# Patient Record
Sex: Male | Born: 1975
Health system: Southern US, Community
[De-identification: ages and names within clinical notes are randomized; demographics above are authoritative.]

## PROBLEM LIST (undated history)

## (undated) DIAGNOSIS — F419 Anxiety disorder, unspecified: Secondary | ICD-10-CM

## (undated) HISTORY — DX: Anxiety disorder, unspecified: F41.9

---

## 2001-08-04 ENCOUNTER — Emergency Department (HOSPITAL_COMMUNITY): Admission: EM | Admit: 2001-08-04 | Discharge: 2001-08-04 | Payer: Self-pay | Admitting: Emergency Medicine

## 2001-08-04 ENCOUNTER — Encounter: Payer: Self-pay | Admitting: Emergency Medicine

## 2015-06-03 ENCOUNTER — Ambulatory Visit (INDEPENDENT_AMBULATORY_CARE_PROVIDER_SITE_OTHER): Payer: 59 | Admitting: Emergency Medicine

## 2015-06-03 VITALS — BP 132/60 | HR 59 | Temp 98.2°F | Resp 16 | Ht 72.0 in | Wt 185.8 lb

## 2015-06-03 DIAGNOSIS — G5702 Lesion of sciatic nerve, left lower limb: Secondary | ICD-10-CM | POA: Diagnosis not present

## 2015-06-03 MED ORDER — NAPROXEN SODIUM 550 MG PO TABS: 550.0000 mg | ORAL_TABLET | Freq: Two times a day (BID) | ORAL | Status: AC

## 2015-06-03 MED ORDER — CYCLOBENZAPRINE HCL 10 MG PO TABS: 10.0000 mg | ORAL_TABLET | Freq: Three times a day (TID) | ORAL | Status: DC | PRN

## 2015-06-03 NOTE — Progress Notes (Signed)
Subjective:  Patient ID: Steve Bender, male    DOB: 13-May-1976  Age: 39 y.o. MRN: [id number]  CC: OTHER   HPI Steve Bender presents  with a two-week history of pain in his right buttock in the area sciatic notch. He has no history of injury or overuse. He has no radicular symptoms numbness tingling or weakness. No radiation of pain. He said his pain is worse when he sits he is unable to sit on his buttock. He said he began be aware of the pain while sitting on the counter to shave his head. He's had no nausea vomiting diarrhea. No stool change. No blood in stool or black stool. No perirectal mass.  History Steve Bender has no past medical history on file.   He has no past surgical history on file.   His  family history is not on file.  He   reports that he has been smoking.  He does not have any smokeless tobacco history on file. He reports that he drinks about 0.6 oz of alcohol per week. He reports that he does not use illicit drugs.  No outpatient prescriptions prior to visit.   No facility-administered medications prior to visit.    History   Social History  . Marital Status: Married    Spouse Name: N/A  . Number of Children: N/A  . Years of Education: N/A   Social History Main Topics  . Smoking status: Current Every Day Smoker  . Smokeless tobacco: Not on file  . Alcohol Use: 0.6 oz/week    0 Standard drinks or equivalent, 1 Cans of beer per week  . Drug Use: No  . Sexual Activity: Not on file   Other Topics Concern  . None   Social History Narrative  . None     Review of Systems  Constitutional: Negative for fever, chills and appetite change.  HENT: Negative for congestion, ear pain, postnasal drip, sinus pressure and sore throat.   Eyes: Negative for pain and redness.  Respiratory: Negative for cough, shortness of breath and wheezing.   Cardiovascular: Negative for leg swelling.  Gastrointestinal: Negative for nausea, vomiting, abdominal pain, diarrhea,  constipation and blood in stool.  Endocrine: Negative for polyuria.  Genitourinary: Negative for dysuria, urgency, frequency and flank pain.  Musculoskeletal: Negative for gait problem.  Skin: Negative for rash.  Neurological: Negative for weakness and headaches.  Psychiatric/Behavioral: Negative for confusion and decreased concentration. The patient is not nervous/anxious.     Objective:  BP 132/60 mmHg  Pulse 59  Temp(Src) 98.2 F (36.8 C) (Oral)  Resp 16  Ht 6' (1.829 m)  Wt 185 lb 12.8 oz (84.278 kg)  BMI 25.19 kg/m2  SpO2 98%  Physical Exam  Constitutional: He is oriented to person, place, and time. He appears well-developed and well-nourished. No distress.  HENT:  Head: Normocephalic and atraumatic.  Right Ear: External ear normal.  Left Ear: External ear normal.  Nose: Nose normal.  Eyes: Conjunctivae and EOM are normal. Pupils are equal, round, and reactive to light. No scleral icterus.  Neck: Normal range of motion. Neck supple. No tracheal deviation present.  Cardiovascular: Normal rate, regular rhythm and normal heart sounds.   Pulmonary/Chest: Effort normal. No respiratory distress. He has no wheezes. He has no rales.  Abdominal: He exhibits no mass. There is no tenderness. There is no rebound and no guarding.  Musculoskeletal: He exhibits no edema.  Lymphadenopathy:    He has no cervical adenopathy.  Neurological: He is  alert and oriented to person, place, and time. Coordination normal.  Skin: Skin is warm and dry. No rash noted.  Psychiatric: He has a normal mood and affect. His behavior is normal.   he was found to have moderate tenderness right in the sciatic notch.    Assessment & Plan:   Harvis was seen today for other.  Diagnoses and all orders for this visit:  Pyriformis syndrome, left  Other orders -     naproxen sodium (ANAPROX DS) 550 MG tablet; Take 1 tablet (550 mg total) by mouth 2 (two) times daily with a meal. -     cyclobenzaprine  (FLEXERIL) 10 MG tablet; Take 1 tablet (10 mg total) by mouth 3 (three) times daily as needed for muscle spasms.   I am having Mr. Seipp start on naproxen sodium and cyclobenzaprine.  Meds ordered this encounter  Medications  . naproxen sodium (ANAPROX DS) 550 MG tablet    Sig: Take 1 tablet (550 mg total) by mouth 2 (two) times daily with a meal.    Dispense:  40 tablet    Refill:  0  . cyclobenzaprine (FLEXERIL) 10 MG tablet    Sig: Take 1 tablet (10 mg total) by mouth 3 (three) times daily as needed for muscle spasms.    Dispense:  30 tablet    Refill:  0    Appropriate red flag conditions were discussed with the patient as well as actions that should be taken.  Patient expressed his understanding.  Follow-up: Return if symptoms worsen or fail to improve.  Carmelina Dane, MD

## 2015-06-10 ENCOUNTER — Ambulatory Visit (INDEPENDENT_AMBULATORY_CARE_PROVIDER_SITE_OTHER): Payer: 59 | Admitting: Gastroenterology

## 2015-06-10 ENCOUNTER — Encounter: Payer: Self-pay | Admitting: Gastroenterology

## 2015-06-10 VITALS — BP 124/60 | HR 84 | Ht 71.25 in | Wt 186.0 lb

## 2015-06-10 DIAGNOSIS — K6289 Other specified diseases of anus and rectum: Secondary | ICD-10-CM

## 2015-06-10 MED ORDER — HYDROCORTISONE ACETATE 25 MG RE SUPP: RECTAL | Status: DC

## 2015-06-10 NOTE — Assessment & Plan Note (Signed)
Rectal discomfort is most likely hemorrhoidal.  There are no obvious lesions or perirectal disease.  Recommendations #1 trial of Anusol HC suppositories

## 2015-06-10 NOTE — Progress Notes (Signed)
    _                                                                                                                History of Present Illness:  Mr. Steve Bender is a 39 year old white male self-referred for evaluation of rectal discomfort.  For several weeks he's noted spontaneous discomfort in the anal area both with sitting and with walking.  He's had no rectal bleeding or pruritus.  He moves his bowels regularly.   History reviewed. No pertinent past medical history. History reviewed. No pertinent past surgical history. family history includes Crohn's disease in his mother. Current Outpatient Prescriptions  Medication Sig Dispense Refill  . cyclobenzaprine (FLEXERIL) 10 MG tablet Take 1 tablet (10 mg total) by mouth 3 (three) times daily as needed for muscle spasms. 30 tablet 0  . naproxen sodium (ANAPROX DS) 550 MG tablet Take 1 tablet (550 mg total) by mouth 2 (two) times daily with a meal. 40 tablet 0   No current facility-administered medications for this visit.   Allergies as of 06/10/2015  . (No Known Allergies)    reports that he has been smoking Cigarettes.  He has never used smokeless tobacco. He reports that he drinks about 1.2 oz of alcohol per week. He reports that he does not use illicit drugs.   Review of Systems: Pertinent positive and negative review of systems were noted in the above HPI section. All other review of systems were otherwise negative.  Vital signs were reviewed in today's medical record Physical Exam: General: Well developed , well nourished, no acute distress Skin: anicteric Head: Normocephalic and atraumatic Eyes:  sclerae anicteric, EOMI Ears: Normal auditory acuity Mouth: No deformity or lesions Neck: Supple, no masses or thyromegaly Lymph Nodes: no lymphadenopathy Lungs: Clear throughout to auscultation Heart: Regular rate and rhythm; no murmurs, rubs or bruits Gastroinestinal: Soft, non tender and non distended. No masses,  hepatosplenomegaly or hernias noted. Normal Bowel sounds Rectal: No external abnormalities Musculoskeletal: Symmetrical with no gross deformities  Skin: No lesions on visible extremities Pulses:  Normal pulses noted Extremities: No clubbing, cyanosis, edema or deformities noted Neurological: Alert oriented x 4, grossly nonfocal Cervical Nodes:  No significant cervical adenopathy Inguinal Nodes: No significant inguinal adenopathy Psychological:  Alert and cooperative. Normal mood and affect  Endoscopy demonstrated a small hemorrhoid in the right posterior position

## 2015-06-10 NOTE — Patient Instructions (Signed)
We will send in your prescription to your pharmacy

## 2015-09-24 ENCOUNTER — Ambulatory Visit: Payer: Self-pay | Admitting: Family Medicine

## 2016-07-14 ENCOUNTER — Encounter (HOSPITAL_BASED_OUTPATIENT_CLINIC_OR_DEPARTMENT_OTHER): Payer: Self-pay

## 2016-07-14 ENCOUNTER — Emergency Department (HOSPITAL_BASED_OUTPATIENT_CLINIC_OR_DEPARTMENT_OTHER): Payer: 59

## 2016-07-14 ENCOUNTER — Emergency Department (HOSPITAL_BASED_OUTPATIENT_CLINIC_OR_DEPARTMENT_OTHER)
Admission: EM | Admit: 2016-07-14 | Discharge: 2016-07-14 | Disposition: A | Discharge status: Home / Self Care | Payer: 59 | Attending: Emergency Medicine | Admitting: Emergency Medicine

## 2016-07-14 DIAGNOSIS — Z87891 Personal history of nicotine dependence: Secondary | ICD-10-CM | POA: Insufficient documentation

## 2016-07-14 DIAGNOSIS — R1013 Epigastric pain: Secondary | ICD-10-CM | POA: Insufficient documentation

## 2016-07-14 DIAGNOSIS — R0789 Other chest pain: Secondary | ICD-10-CM | POA: Insufficient documentation

## 2016-07-14 LAB — CBC WITH DIFFERENTIAL/PLATELET
Basophils Absolute: 0 10*3/uL (ref 0.0–0.1)
Basophils Relative: 1 %
EOS PCT: 5 %
Eosinophils Absolute: 0.4 10*3/uL (ref 0.0–0.7)
HEMATOCRIT: 47.2 % (ref 39.0–52.0)
Hemoglobin: 16.8 g/dL (ref 13.0–17.0)
LYMPHS ABS: 2.6 10*3/uL (ref 0.7–4.0)
LYMPHS PCT: 37 %
MCH: 31 pg (ref 26.0–34.0)
MCHC: 35.6 g/dL (ref 30.0–36.0)
MCV: 87.1 fL (ref 78.0–100.0)
MONO ABS: 0.9 10*3/uL (ref 0.1–1.0)
MONOS PCT: 13 %
NEUTROS ABS: 3.2 10*3/uL (ref 1.7–7.7)
Neutrophils Relative %: 44 %
PLATELETS: 224 10*3/uL (ref 150–400)
RBC: 5.42 MIL/uL (ref 4.22–5.81)
RDW: 12.9 % (ref 11.5–15.5)
WBC: 7.1 10*3/uL (ref 4.0–10.5)

## 2016-07-14 LAB — COMPREHENSIVE METABOLIC PANEL
ALBUMIN: 4.1 g/dL (ref 3.5–5.0)
ALK PHOS: 66 U/L (ref 38–126)
ALT: 27 U/L (ref 17–63)
ANION GAP: 6 (ref 5–15)
AST: 31 U/L (ref 15–41)
BILIRUBIN TOTAL: 0.3 mg/dL (ref 0.3–1.2)
BUN: 18 mg/dL (ref 6–20)
CALCIUM: 9.1 mg/dL (ref 8.9–10.3)
CO2: 27 mmol/L (ref 22–32)
Chloride: 106 mmol/L (ref 101–111)
Creatinine, Ser: 0.97 mg/dL (ref 0.61–1.24)
GFR calc Af Amer: >60 mL/min (ref >60)
GLUCOSE: 76 mg/dL (ref 65–99)
POTASSIUM: 3.9 mmol/L (ref 3.5–5.1)
Sodium: 139 mmol/L (ref 135–145)
TOTAL PROTEIN: 6.7 g/dL (ref 6.5–8.1)

## 2016-07-14 LAB — TROPONIN I: Troponin I: <0.03 ng/mL (ref <0.03)

## 2016-07-14 LAB — LIPASE, BLOOD: LIPASE: 20 U/L (ref 11–51)

## 2016-07-14 MED ORDER — ESOMEPRAZOLE MAGNESIUM 40 MG PO CPDR: 40.0000 mg | DELAYED_RELEASE_CAPSULE | Freq: Every day | ORAL | 1 refills | Status: DC

## 2016-07-14 MED ORDER — KETOROLAC TROMETHAMINE 30 MG/ML IJ SOLN
30.0000 mg | Freq: Once | INTRAMUSCULAR | Status: AC
  Administered 2016-07-14: 30 mg via INTRAVENOUS
  Filled 2016-07-14: qty 1

## 2016-07-14 MED ORDER — ALPRAZOLAM 0.25 MG PO TABS: 0.2500 mg | ORAL_TABLET | Freq: Every evening | ORAL | 0 refills | Status: DC | PRN

## 2016-07-14 MED FILL — ALPRAZolam 0.25 MG TABS: 0.25 | 10 days supply | Qty: 10 | Fill #0

## 2016-07-14 NOTE — Discharge Instructions (Signed)
Take nexium daily.   You can take pepcid 20 mg twice daily as needed.   Take xanax as needed.   [facility] for appointment.   Return to ER if you have worse chest pain, abdominal pain, vomiting, fever, trouble breathing.

## 2016-07-14 NOTE — ED Notes (Signed)
Dr. Silverio LayYao into room prior to RN assessment, see MD notes, pending orders.

## 2016-07-14 NOTE — ED Provider Notes (Signed)
[facility]   CSN: [id number] Arrival date & time: 07/14/16  1304     History   Chief Complaint Chief Complaint  Patient presents with  . Chest Pain    HPI Steve Bender is a 40 y.o. male here with chest pain, abdominal pain. Patient states that he quit smoking about 2 months ago. Since then he feels very jittery and has been very anxious. He also has been having intermittent left-sided chest pain that comes and goes. Denies any shortness of breath. Also has poor appetite and has been having some epigastric pain as well. Denies any nausea or vomiting or fevers. Denies any urinary symptoms. Patient still drinks alcohol occasionally. Has family hx of Crohn's but no personal hx, no blood in stool.   The history is provided by the patient.    History reviewed. No pertinent past medical history.  Patient Active Problem List   Diagnosis Date Noted  . Rectal pain 06/10/2015    History reviewed. No pertinent surgical history.     Home Medications    Prior to Admission medications   Not on File    Family History Family History  Problem Relation Age of Onset  . Crohn's disease Mother     Social History Social History  Substance Use Topics  . Smoking status: Former Smoker    Types: Cigarettes  . Smokeless tobacco: Never Used     Comment: quit June 2017  . Alcohol use 0.0 oz/week     Comment: weekly     Allergies   Review of patient's allergies indicates no known allergies.   Review of Systems Review of Systems  Cardiovascular: Positive for chest pain.  All other systems reviewed and are negative.    Physical Exam Updated Vital Signs BP 106/65   Pulse 72   Temp 98 F (36.7 C) (Oral)   Resp 18   Ht 6\' 1"  (1.854 m)   Wt 200 lb (90.7 kg)   SpO2 99%   BMI 26.39 kg/m   Physical Exam  Constitutional: He is oriented to person, place, and time.  Slightly anxious   HENT:  Head: Normocephalic.  Mouth/Throat: Oropharynx is  clear and moist.  Eyes: EOM are normal. Pupils are equal, round, and reactive to light.  Neck: Normal range of motion. Neck supple.  Cardiovascular: Normal rate, regular rhythm and normal heart sounds.   Pulmonary/Chest: Effort normal and breath sounds normal. No respiratory distress. He has no wheezes. He has no rales.  Mild reproducible chest tenderness   Abdominal: Soft. Bowel sounds are normal.  Mild epigastric tenderness   Musculoskeletal: Normal range of motion. He exhibits no edema.  Neurological: He is alert and oriented to person, place, and time.  Skin: Skin is warm.  Psychiatric: He has a normal mood and affect.  Nursing note and vitals reviewed.    ED Treatments / Results  Labs (all labs ordered are listed, but only abnormal results are displayed) Labs Reviewed  CBC WITH DIFFERENTIAL/PLATELET  TROPONIN I  COMPREHENSIVE METABOLIC PANEL  LIPASE, BLOOD    EKG  EKG Interpretation  Date/Time:  Thursday July 14 2016 13:12:07 EDT Ventricular Rate:  72 PR Interval:  158 QRS Duration: 98 QT Interval:  386 QTC Calculation: 422 R Axis:   97 Text Interpretation:  Normal sinus rhythm Rightward axis Nonspecific T wave abnormality Abnormal ECG No previous ECGs available Confirmed by YAO  MD, DAVID ([id number]) on 07/14/2016 1:23:14 PM       Radiology  Dg Chest 2 View  Result Date: 07/14/2016 CLINICAL DATA:  C/o mid to Lt sided chest pain since he quit smoking 2-3 months ago EXAM: CHEST  2 VIEW COMPARISON:  None. FINDINGS: The heart size and mediastinal contours are within normal limits. Both lungs are clear. No pleural effusion or pneumothorax. The visualized skeletal structures are unremarkable. IMPRESSION: No active cardiopulmonary disease. Electronically Signed   By: Amie Portland M.D.   On: 07/14/2016 13:31    Procedures Procedures (including critical care time)  Medications Ordered in ED Medications  ketorolac (TORADOL) 30 MG/ML injection 30 mg (30 mg Intravenous  Given 07/14/16 1414)     Initial Impression / Assessment and Plan / ED Course  I have reviewed the triage vital signs and the nursing notes.  Pertinent labs & imaging results that were available during my care of the patient were reviewed by me and considered in my medical decision making (see chart for details).  Clinical Course    Steve Bender is a 40 y.o. male here with chest pain, abdominal pain for 2 month. Appears anxious, well appearing. Vitals stable, not tachycardic. I think likely some nicotine withdrawal vs anxiety. Low risk for ACS and I doubt PE. Will get labs, lipase, LFTs. Will give toradol for pain.   3:08 PM Labs unremarkable. CXR unremarkable. I am wondering if he has some reflux vs anxiety. I reassured patient. Will give nexium daily, prn xanax (will only give 10 pills).   Final Clinical Impressions(s) / ED Diagnoses   Final diagnoses:  None    New Prescriptions New Prescriptions   No medications on file     Charlynne Pander, MD 07/14/16 1510

## 2016-07-14 NOTE — ED Notes (Addendum)
Pt reports he quit smoking about 2.5 months ago and since then he has had chest pains for a period of time and then a break. Last night stated he has a pressure that starts along the left side of his neck down to his chest. Reports it started about 2200 last night. Reports last night is the worst it has gotten. Reports has had acid reflux since quit smoking. NAD. Alert and oriented X4. No n/v/d. Reports hx of hemorrhoids. Reports no pain just a feeling a fullness in chest and abdomen.

## 2016-07-14 NOTE — ED Notes (Signed)
[facility] alert, NAD, calm, interactive, resps e/u, no dyspnea noted, ambulatory to xray, steady gait.

## 2016-07-14 NOTE — ED Triage Notes (Signed)
Intermittent CP, abd pain x 2 months-first noticed after quitting smoking-NAD-steady gait

## 2016-08-09 ENCOUNTER — Ambulatory Visit: Payer: 59 | Admitting: Family Medicine

## 2016-08-29 ENCOUNTER — Telehealth: Payer: Self-pay | Admitting: Gastroenterology

## 2016-08-30 NOTE — Telephone Encounter (Signed)
Pt states he has no insurance and wants to see if he could get med refilled that Dr Arlyce DiceKaplan prescribed.  He has symptoms of rectal bleeding and bloating, I advised he needs to be seen because he has not established with another provider.  I did give him the information to apply for the orange card and [facility]  Orange card number to apply is [phone]

## 2016-09-05 ENCOUNTER — Encounter: Payer: Self-pay | Admitting: Gastroenterology

## 2016-09-05 ENCOUNTER — Ambulatory Visit (INDEPENDENT_AMBULATORY_CARE_PROVIDER_SITE_OTHER): Payer: Self-pay | Admitting: Gastroenterology

## 2016-09-05 VITALS — BP 118/76 | HR 76 | Ht 73.0 in | Wt 201.0 lb

## 2016-09-05 DIAGNOSIS — R1013 Epigastric pain: Secondary | ICD-10-CM | POA: Insufficient documentation

## 2016-09-05 DIAGNOSIS — R140 Abdominal distension (gaseous): Secondary | ICD-10-CM

## 2016-09-05 DIAGNOSIS — G8929 Other chronic pain: Secondary | ICD-10-CM | POA: Insufficient documentation

## 2016-09-05 DIAGNOSIS — K644 Residual hemorrhoidal skin tags: Secondary | ICD-10-CM | POA: Insufficient documentation

## 2016-09-05 DIAGNOSIS — K6289 Other specified diseases of anus and rectum: Secondary | ICD-10-CM

## 2016-09-05 MED ORDER — HYDROCORTISONE 2.5 % RE CREA: 1.0000 "application " | TOPICAL_CREAM | Freq: Two times a day (BID) | RECTAL | 1 refills | Status: AC

## 2016-09-05 NOTE — Patient Instructions (Signed)
We have sent medications to your pharmacy for you to pick up at your convenience.  Use hydrocortisone cream for 7-10 more days 2-3 times daily  Use Florastor or culturelle probiotic daily.  Call back in about a week to update on symptoms

## 2016-09-05 NOTE — Progress Notes (Signed)
09/05/2016 Steve Bender [id number] 14-Apr-1976   History of Present Illness:  This is a 40 year old male who was previously seen once by Dr. Arlyce Dice for complaints of rectal pain in July 2016. There were no external abnormalities seen at that time, but he was treated empirically for internal hemorrhoids. He returns to our office on this occasion with several complaints. I had extreme difficulty trying to follow him and really make sense of or connect any of his symptoms. He admits that he has stopped smoking several months ago and has had great increase in anxiety since that time.  First, he complains of rectal discomfort that began 2 or 3 weeks ago. He says that he never noticed any external hemorrhoids previously but now he appears to have one. Has been using over-the-counter Preparation H suppositories and has also used some hydrocortisone 2.5% cream that he had at home, but says that he has only been using that once, sometimes twice daily. He also complains of his entire abdomen feeling bloated. He thinks he is constipated but says that he has soft bowel movements every 2-3 days. Bloating is somewhat worse when he has not had a BM in a day or two.  Denies any complaints of rectal bleeding. He says that yesterday he was pushing on his abdomen and thought he noticed a knot in his upper abdomen. He also says that 2 months ago while he was working out he felt a pop in his groin area and was not able to move for several minutes. He denies any reflux issues recently, but did have some intermittent issues with reflux in the past.  Had 20 pound weight gain when he stopped smoking.  He had a normal CBC, CMP, and lipase well he was in [facility] for chest pain on August 31 (chest pain thought to be anxiety related).  Current Medications, Allergies, Past Medical History, Past Surgical History, Family History and Social History were reviewed in [employer]  record.   Physical Exam: BP 118/76   Pulse 76   Ht 6\' 1"  (1.854 m)   Wt 201 lb (91.2 kg)   BMI 26.52 kg/m  General: Well developed white male in no acute distress Head: Normocephalic and atraumatic Eyes:  Sclerae anicteric, conjunctiva pink  Ears: Normal auditory acuity Lungs: Clear throughout to auscultation Heart: Regular rate and rhythm Abdomen: Soft, non-distended.  Normal bowel sounds.  Non-tender. Rectal:  ? Small left sided thrombosed external hemorrhoid.  No other abnormalities.  DRE did not reveal any masses and he was heme negative. Musculoskeletal: Symmetrical with no gross deformities  Extremities: No edema  Neurological: Alert oriented x 4, grossly non-focal Psychological:  Alert and cooperative. Normal mood and affect  Assessment and Recommendations: -Rectal pain:  Has what appears to be a small, possibly thrombosed external hemorrhoid on exam. Will continue hydrocortisone cream 2-3 times daily for another week. I have asked him to call us back with an update on his symptoms in one week. If hemorrhoid issue seems to not be further improved then we'll refer him to [facility] for evaluation. -Constipation:  Has soft, satisfying bowel movement usually every 2-3 days.  No alarming features. -Bloating:  Likely dietary related as he has recently stopped drinking protein shakes and has changed his eating habits since quitting smoking and decreasing his work-outs/weight lifting.  Also, possibly related to constipation.  I have recommended trying a daily probiotic such as Florastor.   -Epigastric bulge/discomfort:  Unable to  appreciate anything on exam today. Likely musculoskeletal. -Anxiety:  Admits to having a lot of anxiety since he has stopped smoking.  **His care will be assumed by Dr. Adela LankArmbruster in Dr. Marzetta BoardKaplan's absence.

## 2016-09-06 NOTE — Progress Notes (Signed)
Agree with assessment and plan. Of note if pain persists would empirically treat for fissure with 0.125% nitroglycerin ointment. Given this far out from symptoms would think thrombosed hemorrhoid is less likely, but if symptoms persist he should follow up for reassessment to re-evaluate, would consider endoscopic evaluation to clarify etiology.

## 2016-09-08 ENCOUNTER — Ambulatory Visit: Payer: 59 | Admitting: Gastroenterology

## 2016-09-12 ENCOUNTER — Telehealth: Payer: Self-pay | Admitting: Gastroenterology

## 2016-09-13 ENCOUNTER — Telehealth: Payer: Self-pay | Admitting: Gastroenterology

## 2016-09-13 NOTE — Telephone Encounter (Signed)
Pt calling back to check in per PA request

## 2016-09-13 NOTE — Telephone Encounter (Signed)
Schedule for colonoscopy with Dr. Adela LankArmbruster to evaluate.  We will likely keep hearing back from him until he has this done.

## 2016-09-13 NOTE — Telephone Encounter (Signed)
Still having hemorrhoids and abd pain, bloating is better.  Constipation now, no BM in 4 days.  Used a enema and emptied. Pt states his mother has crohn's disease and is concerned.   Brother has IBS.  Rectal cream is not helping.

## 2016-09-13 NOTE — Telephone Encounter (Signed)
Pt notified that he needs to have colon and any recommendations regarding hemorrhoids after the colon

## 2016-09-13 NOTE — Telephone Encounter (Signed)
Pt was advised to have colon with Dr Adela LankArmbruster.  He wants to think about it and discuss with his wife and call back.

## 2016-09-17 ENCOUNTER — Ambulatory Visit (INDEPENDENT_AMBULATORY_CARE_PROVIDER_SITE_OTHER): Payer: Self-pay | Admitting: Osteopathic Medicine

## 2016-09-17 VITALS — BP 116/70 | HR 60 | Temp 97.7°F | Resp 18 | Ht 73.0 in | Wt 198.0 lb

## 2016-09-17 DIAGNOSIS — K5909 Other constipation: Secondary | ICD-10-CM | POA: Insufficient documentation

## 2016-09-17 DIAGNOSIS — F411 Generalized anxiety disorder: Secondary | ICD-10-CM | POA: Insufficient documentation

## 2016-09-17 DIAGNOSIS — R140 Abdominal distension (gaseous): Secondary | ICD-10-CM

## 2016-09-17 DIAGNOSIS — K644 Residual hemorrhoidal skin tags: Secondary | ICD-10-CM

## 2016-09-17 MED ORDER — DICYCLOMINE HCL 20 MG PO TABS: 20.0000 mg | ORAL_TABLET | Freq: Three times a day (TID) | ORAL | 3 refills | Status: AC | PRN

## 2016-09-17 MED ORDER — POLYETHYLENE GLYCOL 3350 17 G PO PACK: 17.0000 g | PACK | Freq: Two times a day (BID) | ORAL | 6 refills | Status: AC | PRN

## 2016-09-17 MED ORDER — BUPROPION HCL ER (XL) 150 MG PO TB24: 150.0000 mg | ORAL_TABLET | ORAL | 3 refills | Status: AC

## 2016-09-17 MED ORDER — ALPRAZOLAM 0.25 MG PO TABS: 0.2500 mg | ORAL_TABLET | Freq: Every day | ORAL | 0 refills | Status: AC | PRN

## 2016-09-17 NOTE — Progress Notes (Signed)
HPI: Steve Bender is a 40 y.o. male  who presents to [facility] today, 09/17/16,  for chief complaint of:  Chief Complaint  Patient presents with  . GI Problem    BLOATING  . Medication Refill    XANAX  . Anxiety     Abdominal concern Reports bloating in abdomen ongoing on and off for past month or so. (+)constipation, typical hard BM every 3 - 4 days. (+)hemorrhoids, he thinks likely d/t weight lifting. Bloating is exacerbating anxiety, he reports abdomen gets so large he looks pregnant. Discomfort is mostly on L with the bloating but he denies it being particularly painful/cramping. No bloody stool. No nausea. Not bloated right now.   Psychiatric concern Pt reports anxiety diagnosed by ER in 06/2016. Was given Xanax 0.25 x10 tablets, has been out of these for about a month. (+)FH anxiety. Never been on medications other than Xanax.  GAD evaluation:  Excessive anxiety/worry 6 mos+: No, repots about 4 mos  Home and work/school: Yes   Difficulty to control: Yes   Associated symptoms (3/adult, 1/child):     Restlessness/on edge Yes      Fatigue No      Concentration No      Irritable Yes      Muscle tension No      Sleep disturbance Yes   Impaired social/occupational functioning: No   Any secondary cause or other concerns?   Major depression Concern given FH  Personality d/o No concern  Social anxiety d/o No concern  Obsessive Compulsive d/o No concern  PTSD No concern  Eating d/o or Body Dysmorphic d/o No concern   Patient is accompanied by wife who assists with history-taking.    Past medical, surgical, social and family history reviewed: Past Medical History:  Diagnosis Date  . Anxiety    History reviewed. No pertinent surgical history. Social History  Substance Use Topics  . Smoking status: Former Smoker    Types: Cigarettes  . Smokeless tobacco: Never Used     Comment: quit June 2017  . Alcohol use 0.0 oz/week     Comment:  weekly   Family History  Problem Relation Age of Onset  . Crohn's disease Mother      Current medication list and allergy/intolerance information reviewed:   Current Outpatient Prescriptions  Medication Sig Dispense Refill  . ALPRAZolam (XANAX) 0.25 MG tablet Take 1 tablet (0.25 mg total) by mouth at bedtime as needed for anxiety. 10 tablet 0  . hydrocortisone (ANUSOL-HC) 2.5 % rectal cream Place 1 application rectally 2 (two) times daily. 30 g 1   No current facility-administered medications for this visit.    No Known Allergies    Review of Systems:  Constitutional:  No  fever, no chills, No recent illness, No unintentional weight changes. No significant fatigue.   Cardiac: No  chest pain, No  pressure, No palpitations  Respiratory:  No  shortness of breath. No  Cough  Gastrointestinal: No  abdominal pain, No  nausea, No  vomiting,  No  blood in stool, No  diarrhea, +Constipation, +bloating as per HPI  Skin: No  Rash  Psychiatric: No  concerns with depression, +concerns with anxiety, No sleep problems, No mood problems.   Exam:  BP 116/70 (BP Location: Right Arm, Patient Position: Sitting, Cuff Size: Small)   Pulse 60   Temp 97.7 F (36.5 C) (Oral)   Resp 18   Ht _0  (1.854 m)   Wt 198  lb (89.8 kg)   SpO2 99%   BMI 26.12 kg/m   Constitutional: VS see above. General Appearance: alert, well-developed, well-nourished, NAD  Eyes: Normal lids and conjunctive, non-icteric sclera  Ears, Nose, Mouth, Throat: MMM, Normal external inspection ears/nares/mouth/lips/gums.   Neck: No masses, trachea midline. No thyroid enlargement.   Respiratory: Normal respiratory effort. no wheeze, no rhonchi, no rales  Cardiovascular: S1/S2 normal, no murmur, no rub/gallop auscultated. RRR. No lower extremity edema.  Gastrointestinal: Nontender, no masses. No hepatomegaly, no splenomegaly. No hernia appreciated. Bowel sounds normal. Rectal exam deferred. NO guarding or  rebound  Musculoskeletal: Gait normal.   Neurological: Normal balance/coordination. No tremor.   Skin: warm, dry, intact.   Psychiatric: Normal judgment/insight. Normal mood and affect. Oriented x3.     ASSESSMENT/PLAN:  Plan for limited refill Xanax prn sparing use, can try Wellbutrin since this seems to work well for his wife and less chance of sexual side effects or weight gain, can trial bentyl but needs to be increasing fiber/water intake and aerobic exercise, trial elimination diets to improve bloating, and follow-up if no improvement.    Patient was upset when I outlined plan to him of medications as below with follow-up if no better - he states he was told he could get set up with primary care and feels he should not have to come back and pay for subsequent visits or follow-ups, especially given he has no insurance. He feels he should be given more refills.   I explained that for any patient, to ensure safe and effective treatment, follow-up visits may be advised and may be a condition of authorizing refills. Controlled substances in particular typically require close monitoring as a matter of course.   I work at [employer], so I cannot be his PCP here. Jaynee Eagles, PA-C introduced himself to the patient and discussed follow-up plan. See his note for further details   Anxiety state - Plan: buPROPion (WELLBUTRIN XL) 150 MG 24 hr tablet, ALPRAZolam (XANAX) 0.25 MG tablet, Ambulatory referral to Psychiatry  Abdominal bloating - Plan: dicyclomine (BENTYL) 20 MG tablet  Chronic constipation - Plan: polyethylene glycol (MIRALAX / GLYCOLAX) packet  External hemorrhoids  Patient Instructions   If the medications are not helping you, or your symptoms change or get worse, please return for reevaluation and further discussion of treatment options.   If you are doing well on the medicines, refills will be made at the discretion of your PCP here - please call the clinic when you  are running low and it will be discussed with [facility].    Please contact Charlena Cross, our Practice Administrator, or Dr. Reginia Forts, our Medical Director, for feedback regarding your visit and the expectations you were provided. Leave a message with a time that you would be available to be reached. The phone number are [phone]. I apologize for the fact that you were needs were not met the way you expected and will work with you to make sure we help you going forward whether it is through primary care or coordinating referrals to the right practice/specialty.    IF you received an x-ray today, you will receive an invoice from [facility]. Please contact [facility] at [phone] with questions or concerns regarding your invoice.   IF you received labwork today, you will receive an invoice from [employer]. Please contact [facility] at [phone] with questions or concerns regarding your invoice.   Our billing staff will not be able to assist  you with questions regarding bills from these companies.  You will be contacted with the lab results as soon as they are available. The fastest way to get your results is to activate your My Chart account. Instructions are located on the last page of this paperwork. If you have not heard from [place] regarding the results in 2 weeks, please contact this office.      Visit summary with medication list and pertinent instructions was printed for patient to review. All questions at time of visit were answered - patient instructed to contact office with any additional concerns. ER/RTC precautions were reviewed with the patient. Follow-up plan: Return if symptoms worsen or fail to improve.

## 2016-09-17 NOTE — Patient Instructions (Addendum)
If the medications are not helping you, or your symptoms change or get worse, please return for reevaluation and further discussion of treatment options.   If you are doing well on the medicines, refills will be made at the discretion of your PCP here - please call the clinic when you are running low and it will be discussed with [facility].    Please contact Charlena Cross, our Practice Administrator, or Dr. Reginia Forts, our Medical Director, for feedback regarding your visit and the expectations you were provided. Leave a message with a time that you would be available to be reached. The phone number are [phone]. I apologize for the fact that you were needs were not met the way you expected and will work with you to make sure we help you going forward whether it is through primary care or coordinating referrals to the right practice/specialty.    IF you received an x-ray today, you will receive an invoice from [facility]. Please contact [facility] at [phone] with questions or concerns regarding your invoice.   IF you received labwork today, you will receive an invoice from [employer]. Please contact [facility] at [phone] with questions or concerns regarding your invoice.   Our billing staff will not be able to assist you with questions regarding bills from these companies.  You will be contacted with the lab results as soon as they are available. The fastest way to get your results is to activate your My Chart account. Instructions are located on the last page of this paperwork. If you have not heard from [place] regarding the results in 2 weeks, please contact this office.

## 2016-09-26 ENCOUNTER — Telehealth: Payer: Self-pay | Admitting: Gastroenterology

## 2016-09-26 NOTE — Telephone Encounter (Signed)
Spoke to patient, he saw an urgent care Md. Patient wondered why going right to a colonoscopy vs blood work or any other less expensive testing. Patient complaining of abdominal bloating, constipation, then soft bm's. Patient admits that his mother has Crohn's and his brother has IBS. Explained that the best way to visualize the colon is by a colonoscopy. Told him that there are radiologic tests that can be done, but some of them are also expensive and may not give answers to his symptoms. Told him that by having the colonoscopy the doctor can also do biopsies if needed to further diagnose. Patient was satisfied with this explanation and will proceed with the current plan. Scheduled for 10/14/16.

## 2016-09-27 NOTE — Telephone Encounter (Signed)
Thanks for clarifying for him Raynelle FanningJulie, I appreciate it. We will see him for his scheduled colonoscopy

## 2016-10-14 ENCOUNTER — Encounter: Payer: Self-pay | Admitting: Gastroenterology

## 2017-01-05 ENCOUNTER — Emergency Department (HOSPITAL_BASED_OUTPATIENT_CLINIC_OR_DEPARTMENT_OTHER)
Admission: EM | Admit: 2017-01-05 | Discharge: 2017-01-05 | Disposition: A | Discharge status: Home / Self Care | Payer: Self-pay | Attending: Emergency Medicine | Admitting: Emergency Medicine

## 2017-01-05 ENCOUNTER — Encounter (HOSPITAL_BASED_OUTPATIENT_CLINIC_OR_DEPARTMENT_OTHER): Payer: Self-pay | Admitting: Emergency Medicine

## 2017-01-05 ENCOUNTER — Emergency Department (HOSPITAL_BASED_OUTPATIENT_CLINIC_OR_DEPARTMENT_OTHER): Payer: Self-pay

## 2017-01-05 DIAGNOSIS — R109 Unspecified abdominal pain: Secondary | ICD-10-CM

## 2017-01-05 DIAGNOSIS — Z87891 Personal history of nicotine dependence: Secondary | ICD-10-CM | POA: Insufficient documentation

## 2017-01-05 DIAGNOSIS — R1013 Epigastric pain: Secondary | ICD-10-CM | POA: Insufficient documentation

## 2017-01-05 DIAGNOSIS — G8929 Other chronic pain: Secondary | ICD-10-CM | POA: Insufficient documentation

## 2017-01-05 LAB — URINALYSIS, ROUTINE W REFLEX MICROSCOPIC
GLUCOSE, UA: NEGATIVE mg/dL
Hgb urine dipstick: NEGATIVE
KETONES UR: 15 mg/dL — AB
Leukocytes, UA: NEGATIVE
NITRITE: NEGATIVE
PH: 5.5 (ref 5.0–8.0)
PROTEIN: NEGATIVE mg/dL
Specific Gravity, Urine: 1.036 — ABNORMAL HIGH (ref 1.005–1.030)

## 2017-01-05 LAB — COMPREHENSIVE METABOLIC PANEL
ALBUMIN: 4.4 g/dL (ref 3.5–5.0)
ALK PHOS: 58 U/L (ref 38–126)
ALT: 25 U/L (ref 17–63)
AST: 37 U/L (ref 15–41)
Anion gap: 8 (ref 5–15)
BILIRUBIN TOTAL: 0.5 mg/dL (ref 0.3–1.2)
BUN: 17 mg/dL (ref 6–20)
CO2: 23 mmol/L (ref 22–32)
Calcium: 9.1 mg/dL (ref 8.9–10.3)
Chloride: 107 mmol/L (ref 101–111)
Creatinine, Ser: 1.04 mg/dL (ref 0.61–1.24)
GFR calc Af Amer: >60 mL/min (ref >60)
Glucose, Bld: 88 mg/dL (ref 65–99)
POTASSIUM: 3.7 mmol/L (ref 3.5–5.1)
Sodium: 138 mmol/L (ref 135–145)
TOTAL PROTEIN: 6.9 g/dL (ref 6.5–8.1)

## 2017-01-05 LAB — CBC
HEMATOCRIT: 43.2 % (ref 39.0–52.0)
Hemoglobin: 15.4 g/dL (ref 13.0–17.0)
MCH: 30.9 pg (ref 26.0–34.0)
MCHC: 35.6 g/dL (ref 30.0–36.0)
MCV: 86.7 fL (ref 78.0–100.0)
PLATELETS: 224 10*3/uL (ref 150–400)
RBC: 4.98 MIL/uL (ref 4.22–5.81)
RDW: 12.7 % (ref 11.5–15.5)
WBC: 6.9 10*3/uL (ref 4.0–10.5)

## 2017-01-05 LAB — LIPASE, BLOOD: Lipase: 18 U/L (ref 11–51)

## 2017-01-05 MED ORDER — IOPAMIDOL (ISOVUE-300) INJECTION 61%
100.0000 mL | Freq: Once | INTRAVENOUS | Status: AC | PRN
  Administered 2017-01-05: 100 mL via INTRAVENOUS

## 2017-01-05 MED ORDER — OMEPRAZOLE 20 MG PO CPDR: 20.0000 mg | DELAYED_RELEASE_CAPSULE | Freq: Every day | ORAL | 0 refills | Status: AC

## 2017-01-05 MED ORDER — GI COCKTAIL ~~LOC~~
ORAL | Status: AC
  Filled 2017-01-05: qty 30

## 2017-01-05 MED ORDER — GI COCKTAIL ~~LOC~~
30.0000 mL | Freq: Once | ORAL | Status: AC
  Administered 2017-01-05: 30 mL via ORAL

## 2017-01-05 NOTE — ED Provider Notes (Signed)
[facility] Provider Note   CSN: [id number] Arrival date & time: 01/05/17  1918  By signing my name below, I, Steve Bender, attest that this documentation has been prepared under the direction and in the presence of Steve CoreNathan Jumana Paccione, MD . Electronically Signed: Nelwyn SalisburyJoshua Bender, Scribe. 01/05/2017. 8:06 PM.  History   Chief Complaint Chief Complaint  Patient presents with  . Abdominal Pain   The history is provided by the patient. No language interpreter was used.    HPI Comments:  Steve MarketJoshua Bender is a 41 y.o. male who presents to the Emergency Department complaining of chronic, waxining/waning, abdominal pain for the past 6 months. He describes his symptoms as a 6/10 pain that is occasionally exacerbated by eating. Pt has been seen by several providers in the past including [facility], ED physicians, and UC physicians and has not been able to get any answers. He reports associated itchiness, worsened after bowel movements. Pt states that his pain today became so severe that he felt he needed to come to the ED. He denies any fever, constipation or bloody stool. Pt notes that he has changed his lifestyle dramatically since onset of symptoms. He no longer smokes, does not work out anymore, and has lost 20lbs.  Past Medical History:  Diagnosis Date  . Anxiety     Patient Active Problem List   Diagnosis Date Noted  . Anxiety state 09/17/2016  . Chronic constipation 09/17/2016  . External hemorrhoids 09/05/2016  . Abdominal bloating 09/05/2016  . Abdominal discomfort, epigastric 09/05/2016  . Rectal pain 06/10/2015    History reviewed. No pertinent surgical history.   Home Medications    Prior to Admission medications   Medication Sig Start Date End Date Taking? Authorizing Provider  ALPRAZolam (XANAX) 0.25 MG tablet Take 1 tablet (0.25 mg total) by mouth daily as needed for anxiety (use sparingly to avoid tolerance/dependence). 09/17/16   Sunnie NielsenNatalie Alexander,  DO  buPROPion (WELLBUTRIN XL) 150 MG 24 hr tablet Take 1 tablet (150 mg total) by mouth every morning. 09/17/16   Sunnie NielsenNatalie Alexander, DO  dicyclomine (BENTYL) 20 MG tablet Take 1 tablet (20 mg total) by mouth 3 (three) times daily as needed (before meals for abdominal pain/bloatin). 09/17/16   Sunnie NielsenNatalie Alexander, DO  hydrocortisone (ANUSOL-HC) 2.5 % rectal cream Place 1 application rectally 2 (two) times daily. 09/05/16   Jessica D Zehr, PA-C  omeprazole (PRILOSEC) 20 MG capsule Take 1 capsule (20 mg total) by mouth daily. 01/05/17   Steve CoreNathan Jazlen Ogarro, MD  polyethylene glycol [facility](MIRALAX / Ethelene HalGLYCOLAX) packet Take 17 g by mouth 2 (two) times daily as needed for moderate constipation. 09/17/16   Sunnie NielsenNatalie Alexander, DO    Family History Family History  Problem Relation Age of Onset  . Crohn's disease Mother     Social History Social History  Substance Use Topics  . Smoking status: Former Smoker    Types: Cigarettes  . Smokeless tobacco: Never Used     Comment: quit June 2017  . Alcohol use 0.0 oz/week     Comment: weekly     Allergies   Patient has no known allergies.   Review of Systems Review of Systems  Constitutional: Negative for fever.  Gastrointestinal: Positive for abdominal pain. Negative for blood in stool and constipation.  All other systems reviewed and are negative.    Physical Exam Updated Vital Signs BP 103/74 (BP Location: Right Arm)   Pulse 61   Temp 98.8 F (37.1 C) (Oral)   Resp 16  Ht 6\' 1"  (1.854 m)   Wt 198 lb (89.8 kg)   SpO2 98%   BMI 26.12 kg/m   Physical Exam  Constitutional: He is oriented to person, place, and time. He appears well-developed and well-nourished. No distress.  HENT:  Head: Normocephalic and atraumatic.  Eyes: Conjunctivae are normal.  Cardiovascular: Normal rate.   Pulmonary/Chest: Effort normal.  Abdominal: Soft. He exhibits no distension and no mass. There is tenderness. There is no rebound and no guarding. No hernia.  Mild  epigastric tenderness. No RUQ tenderness.  Neurological: He is alert and oriented to person, place, and time.  Skin: Skin is warm and dry.  Psychiatric: He has a normal mood and affect.  Nursing note and vitals reviewed.    ED Treatments / Results  COORDINATION OF CARE:  8:16 PM Discussed treatment plan with pt at bedside which includes CT abdomen and pt agreed to plan.  Labs (all labs ordered are listed, but only abnormal results are displayed) Labs Reviewed  URINALYSIS, ROUTINE W REFLEX MICROSCOPIC - Abnormal; Notable for the following:       Result Value   Specific Gravity, Urine 1.036 (*)    Bilirubin Urine SMALL (*)    Ketones, ur 15 (*)    All other components within normal limits  LIPASE, BLOOD  COMPREHENSIVE METABOLIC PANEL  CBC    EKG  EKG Interpretation None       Radiology Ct Abdomen Pelvis W Contrast  Result Date: 01/05/2017 CLINICAL DATA:  Epigastric and lower abdominal pain for 6 months EXAM: CT ABDOMEN AND PELVIS WITH CONTRAST TECHNIQUE: Multidetector CT imaging of the abdomen and pelvis was performed using the standard protocol following bolus administration of intravenous contrast. CONTRAST:  ISOVUE-300 IOPAMIDOL (ISOVUE-300) INJECTION 61% COMPARISON:  None. FINDINGS: Lower chest: No acute abnormality. Hepatobiliary: No focal liver abnormality is seen. No gallstones, gallbladder wall thickening, or biliary dilatation. Pancreas: Unremarkable. No pancreatic ductal dilatation or surrounding inflammatory changes. Spleen: Normal in size without focal abnormality. Adrenals/Urinary Tract: Adrenal glands are unremarkable. Kidneys are normal, without renal calculi, focal lesion, or hydronephrosis. Bladder is unremarkable. Stomach/Bowel: Stomach is within normal limits. Appendix appears normal. No evidence of bowel wall thickening, distention, or inflammatory changes. Few colon diverticula without acute inflammation. Vascular/Lymphatic: No significant vascular  findings are present. No enlarged abdominal or pelvic lymph nodes. Reproductive: Prostate is unremarkable. Other: Tiny fat containing umbilical hernia. No abdominopelvic ascites. Musculoskeletal: No acute or significant osseous findings. IMPRESSION: No CT evidence for acute intra-abdominal or pelvic pathology. Mild sigmoid colon diverticula without acute inflammation. Electronically Signed   By: Jasmine Pang M.D.   On: 01/05/2017 21:19    Procedures Procedures (including critical care time)  Medications Ordered in ED Medications  iopamidol (ISOVUE-300) 61 % injection 100 mL (100 mLs Intravenous Contrast Given 01/05/17 2102)  gi cocktail (Maalox,Lidocaine,Donnatal) (30 mLs Oral Given 01/05/17 2146)     Initial Impression / Assessment and Plan / ED Course  I have reviewed the triage vital signs and the nursing notes.  Pertinent labs & imaging results that were available during my care of the patient were reviewed by me and considered in my medical decision making (see chart for details).     Patient presents with abdominal pain. His head of the last 6 months. Has good days and bad days. Has been scoped from below with no clear cause. Lab work reassuring. CT scan done and reassuring. Will discharge home with trial of omeprazole.  Final Clinical Impressions(s) / ED  Diagnoses   Final diagnoses:  Chronic abdominal pain    New Prescriptions New Prescriptions   OMEPRAZOLE (PRILOSEC) 20 MG CAPSULE    Take 1 capsule (20 mg total) by mouth daily.  I personally performed the services described in this documentation, which was scribed in my presence. The recorded information has been reviewed and is accurate.        Steve Core, MD 01/05/17 2218

## 2017-01-05 NOTE — ED Notes (Signed)
Patient transported to CT 

## 2017-01-05 NOTE — ED Triage Notes (Signed)
Patient states that he has had pain to his epigastric region x 6 months. Some days it is better than other, today is "really bad"  - the patient reports that he has seen multiple MD's for this and procedures and no can tell him what it is

## 2018-09-09 IMAGING — CT CT ABD-PELV W/ CM
2 of 4 series · 16 of 46 positions shown, 18 images · IV contrast (APPLIED)
Comparison: None.

CLINICAL DATA: Epigastric and lower abdominal pain for 6 months

EXAM:
CT ABDOMEN AND PELVIS WITH CONTRAST
TECHNIQUE: Multidetector CT imaging of the abdomen and pelvis was performed
using the standard protocol following bolus administration of
intravenous contrast.
CONTRAST:  100mL R1AAR6-MDD IOPAMIDOL (R1AAR6-MDD) INJECTION 61%

[Series 2: axial st · axial · 0.90mm/px · z∈[-483,-38]mm · 13 of 97 slices shown, 15 images]
[im 4/97  soft-tissue]
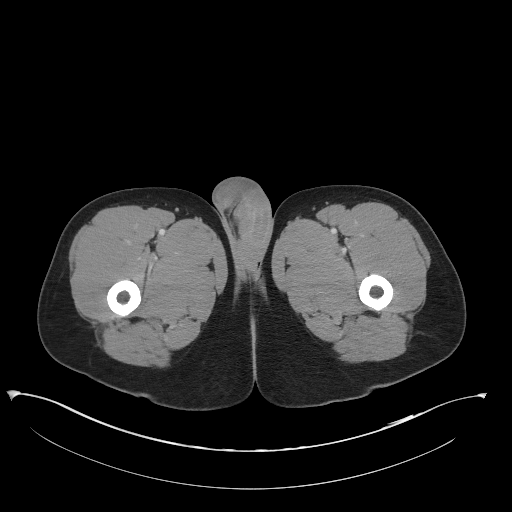
[im 4/97  bone]
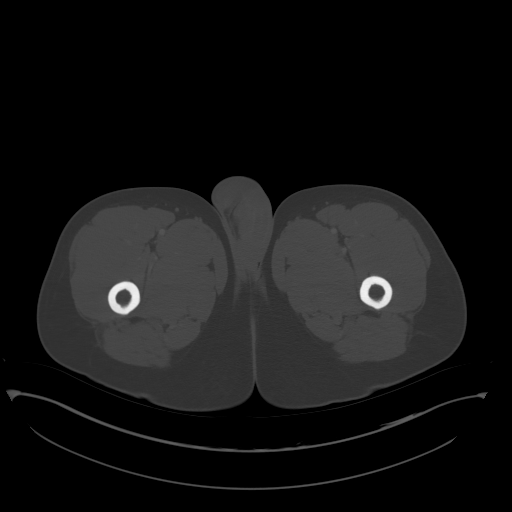
[im 12/97  soft-tissue]
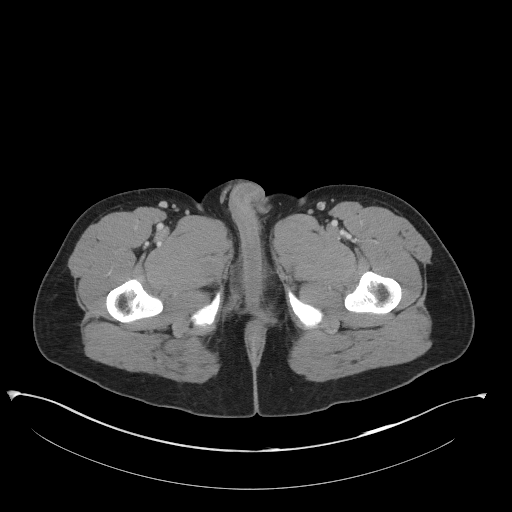
[im 20/97  soft-tissue]
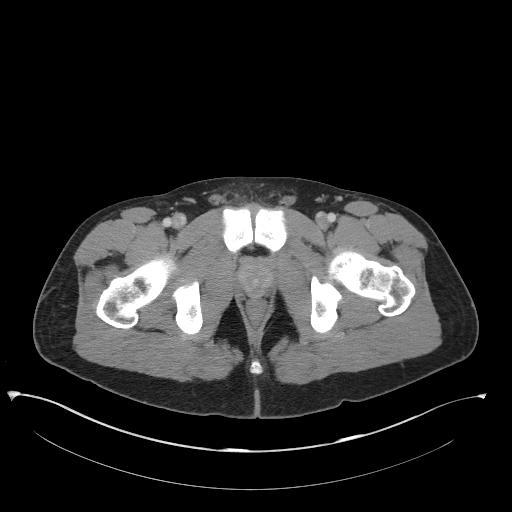
[im 27/97  soft-tissue]
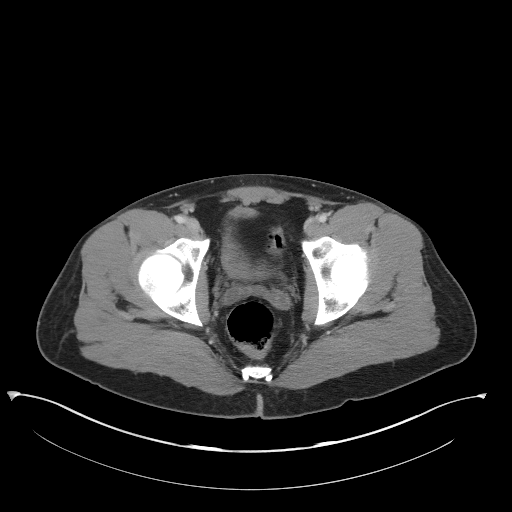
[im 35/97  soft-tissue]
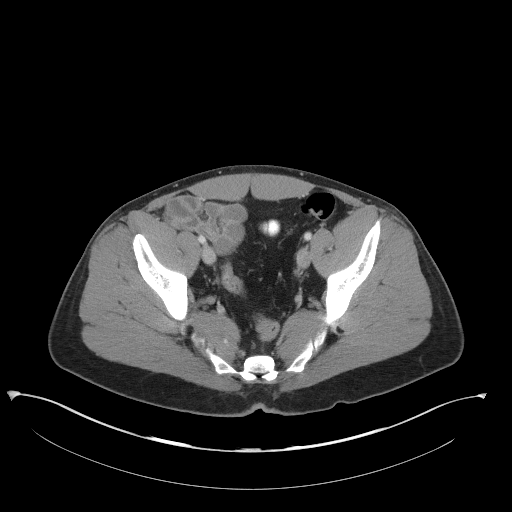
[im 43/97  soft-tissue]
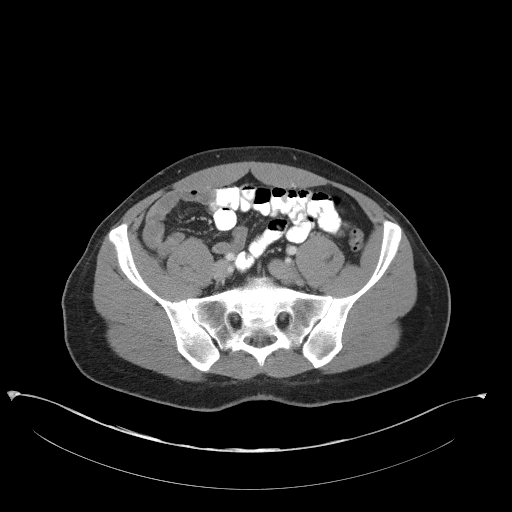
[im 50/97  soft-tissue]
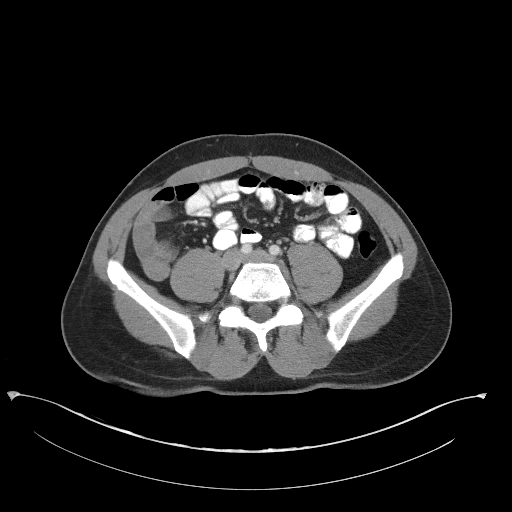
[im 54/97  soft-tissue]
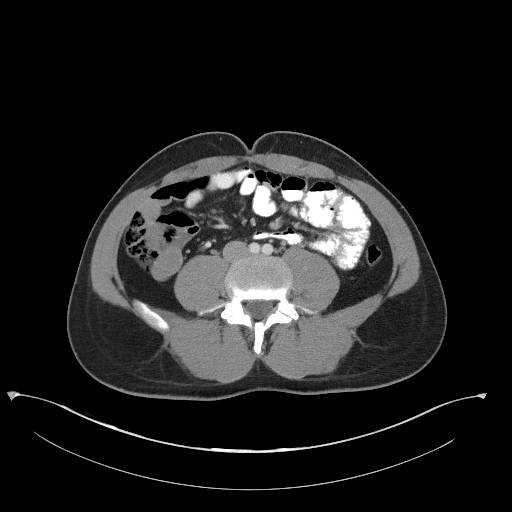
[im 62/97  soft-tissue]
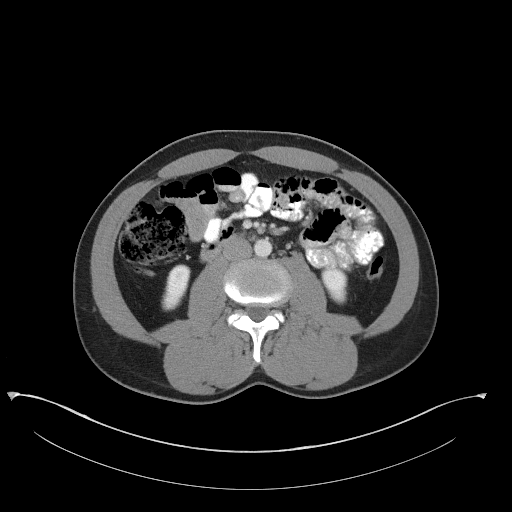
[im 62/97  bone]
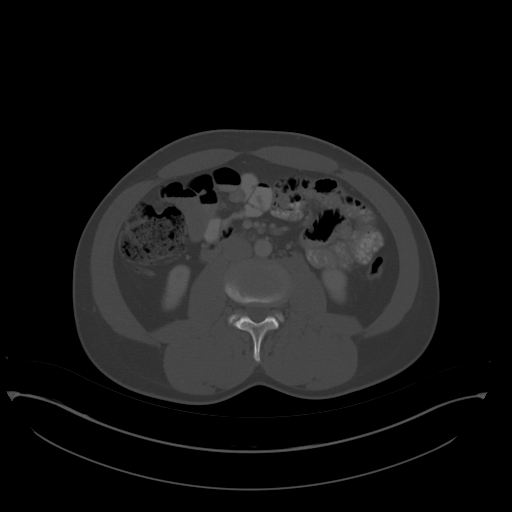
[im 70/97  soft-tissue]
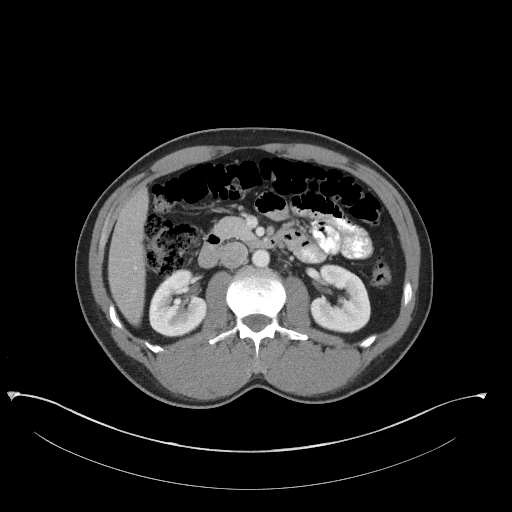
[im 77/97  soft-tissue]
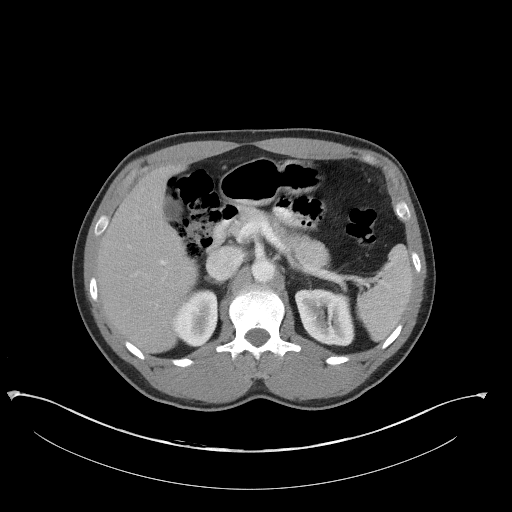
[im 85/97  soft-tissue]
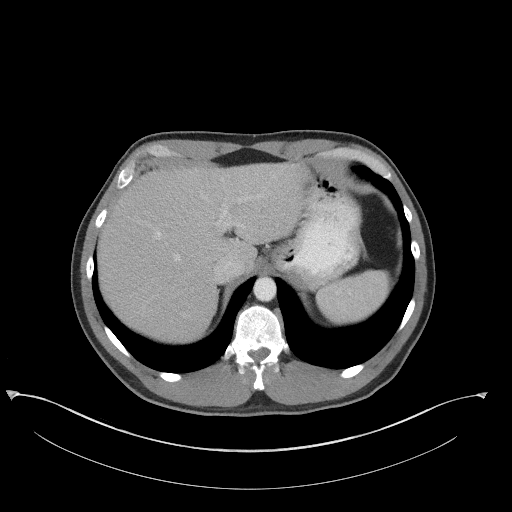
[im 93/97  soft-tissue]
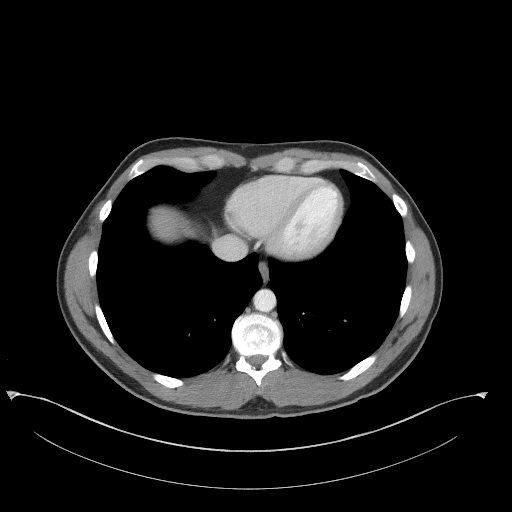

[Series 5: coronal st · coronal · 0.78mm/px · 3 of 85 slices shown]
[im 29/85  soft-tissue]
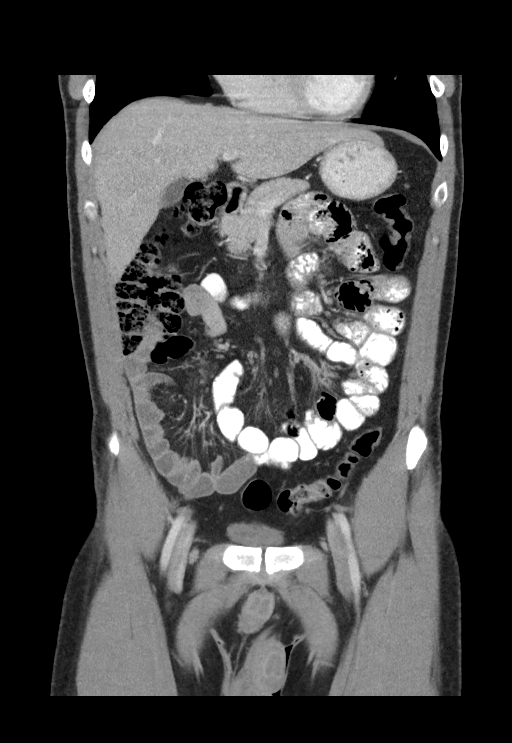
[im 38/85  soft-tissue]
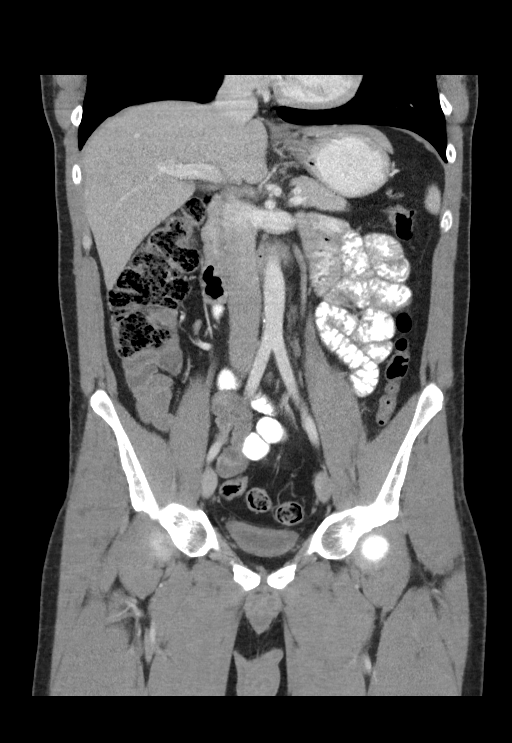
[im 47/85  soft-tissue]
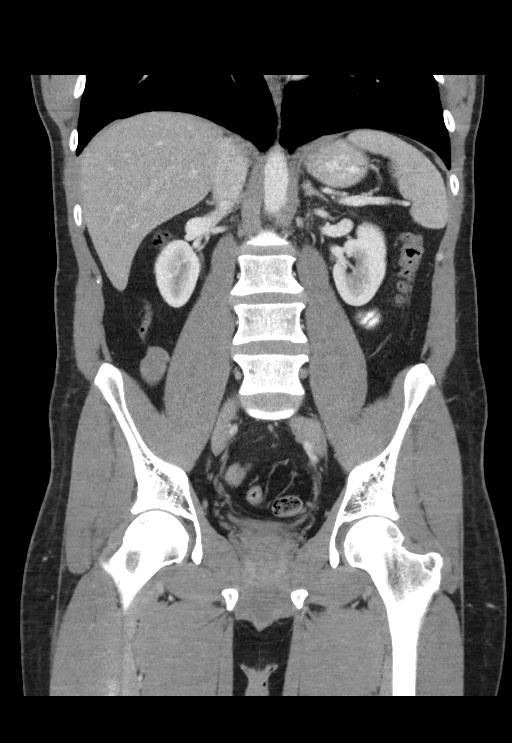

[16 of 46 positions shown; findings below may reference images not displayed]

FINDINGS: Lower chest: No acute abnormality.

Hepatobiliary: No focal liver abnormality is seen. No gallstones,
gallbladder wall thickening, or biliary dilatation.

Pancreas: Unremarkable. No pancreatic ductal dilatation or
surrounding inflammatory changes.

Spleen: Normal in size without focal abnormality.

Adrenals/Urinary Tract: Adrenal glands are unremarkable. Kidneys are
normal, without renal calculi, focal lesion, or hydronephrosis.
Bladder is unremarkable.

Stomach/Bowel: Stomach is within normal limits. Appendix appears
normal. No evidence of bowel wall thickening, distention, or
inflammatory changes. Few colon diverticula without acute
inflammation.

Vascular/Lymphatic: No significant vascular findings are present. No
enlarged abdominal or pelvic lymph nodes.

Reproductive: Prostate is unremarkable.

Other: Tiny fat containing umbilical hernia. No abdominopelvic
ascites.

Musculoskeletal: No acute or significant osseous findings.
IMPRESSION: No CT evidence for acute intra-abdominal or pelvic pathology. Mild
sigmoid colon diverticula without acute inflammation.

## 2021-10-20 DIAGNOSIS — F900 Attention-deficit hyperactivity disorder, predominantly inattentive type: Secondary | ICD-10-CM | POA: Diagnosis not present

## 2021-10-20 DIAGNOSIS — F411 Generalized anxiety disorder: Secondary | ICD-10-CM | POA: Diagnosis not present

## 2022-02-11 DIAGNOSIS — Z0100 Encounter for examination of eyes and vision without abnormal findings: Secondary | ICD-10-CM | POA: Diagnosis not present

## 2022-05-12 DIAGNOSIS — F411 Generalized anxiety disorder: Secondary | ICD-10-CM | POA: Diagnosis not present

## 2022-05-12 DIAGNOSIS — F900 Attention-deficit hyperactivity disorder, predominantly inattentive type: Secondary | ICD-10-CM | POA: Diagnosis not present

## 2023-01-19 DIAGNOSIS — R300 Dysuria: Secondary | ICD-10-CM | POA: Diagnosis not present

## 2023-01-19 DIAGNOSIS — N419 Inflammatory disease of prostate, unspecified: Secondary | ICD-10-CM | POA: Diagnosis not present

## 2023-04-06 DIAGNOSIS — F900 Attention-deficit hyperactivity disorder, predominantly inattentive type: Secondary | ICD-10-CM | POA: Diagnosis not present

## 2023-04-06 DIAGNOSIS — F411 Generalized anxiety disorder: Secondary | ICD-10-CM | POA: Diagnosis not present

## 2023-06-09 ENCOUNTER — Other Ambulatory Visit: Payer: Self-pay

## 2023-06-09 ENCOUNTER — Emergency Department (HOSPITAL_BASED_OUTPATIENT_CLINIC_OR_DEPARTMENT_OTHER): Payer: BC Managed Care – PPO

## 2023-06-09 ENCOUNTER — Emergency Department (HOSPITAL_BASED_OUTPATIENT_CLINIC_OR_DEPARTMENT_OTHER)
Admission: EM | Admit: 2023-06-09 | Discharge: 2023-06-09 | Disposition: A | Discharge status: Home / Self Care | Payer: BC Managed Care – PPO | Attending: Emergency Medicine | Admitting: Emergency Medicine

## 2023-06-09 DIAGNOSIS — R0789 Other chest pain: Secondary | ICD-10-CM | POA: Insufficient documentation

## 2023-06-09 DIAGNOSIS — S3991XA Unspecified injury of abdomen, initial encounter: Secondary | ICD-10-CM | POA: Diagnosis not present

## 2023-06-09 DIAGNOSIS — W11XXXA Fall on and from ladder, initial encounter: Secondary | ICD-10-CM | POA: Diagnosis not present

## 2023-06-09 DIAGNOSIS — S91311A Laceration without foreign body, right foot, initial encounter: Secondary | ICD-10-CM | POA: Diagnosis not present

## 2023-06-09 DIAGNOSIS — S299XXA Unspecified injury of thorax, initial encounter: Secondary | ICD-10-CM | POA: Diagnosis not present

## 2023-06-09 DIAGNOSIS — S0181XA Laceration without foreign body of other part of head, initial encounter: Secondary | ICD-10-CM

## 2023-06-09 DIAGNOSIS — S3993XA Unspecified injury of pelvis, initial encounter: Secondary | ICD-10-CM | POA: Diagnosis not present

## 2023-06-09 DIAGNOSIS — M79661 Pain in right lower leg: Secondary | ICD-10-CM | POA: Diagnosis not present

## 2023-06-09 DIAGNOSIS — S81811A Laceration without foreign body, right lower leg, initial encounter: Secondary | ICD-10-CM

## 2023-06-09 DIAGNOSIS — Z23 Encounter for immunization: Secondary | ICD-10-CM | POA: Insufficient documentation

## 2023-06-09 DIAGNOSIS — S81812A Laceration without foreign body, left lower leg, initial encounter: Secondary | ICD-10-CM | POA: Diagnosis not present

## 2023-06-09 DIAGNOSIS — S60511A Abrasion of right hand, initial encounter: Secondary | ICD-10-CM | POA: Insufficient documentation

## 2023-06-09 DIAGNOSIS — S0990XA Unspecified injury of head, initial encounter: Secondary | ICD-10-CM | POA: Diagnosis not present

## 2023-06-09 DIAGNOSIS — K429 Umbilical hernia without obstruction or gangrene: Secondary | ICD-10-CM | POA: Diagnosis not present

## 2023-06-09 DIAGNOSIS — R079 Chest pain, unspecified: Secondary | ICD-10-CM | POA: Diagnosis not present

## 2023-06-09 DIAGNOSIS — S199XXA Unspecified injury of neck, initial encounter: Secondary | ICD-10-CM | POA: Diagnosis not present

## 2023-06-09 LAB — CBC WITH DIFFERENTIAL/PLATELET
Abs Immature Granulocytes: 0.06 10*3/uL (ref 0.00–0.07)
Basophils Absolute: 0.1 10*3/uL (ref 0.0–0.1)
Basophils Relative: 1 %
Eosinophils Absolute: 0.2 10*3/uL (ref 0.0–0.5)
Eosinophils Relative: 3 %
HCT: 50.6 % (ref 39.0–52.0)
Hemoglobin: 17.4 g/dL — ABNORMAL HIGH (ref 13.0–17.0)
Immature Granulocytes: 1 %
Lymphocytes Relative: 31 %
Lymphs Abs: 2.6 10*3/uL (ref 0.7–4.0)
MCH: 30.7 pg (ref 26.0–34.0)
MCHC: 34.4 g/dL (ref 30.0–36.0)
MCV: 89.4 fL (ref 80.0–100.0)
Monocytes Absolute: 0.8 10*3/uL (ref 0.1–1.0)
Monocytes Relative: 10 %
Neutro Abs: 4.7 10*3/uL (ref 1.7–7.7)
Neutrophils Relative %: 54 %
Platelets: 253 10*3/uL (ref 150–400)
RBC: 5.66 MIL/uL (ref 4.22–5.81)
RDW: 13.5 % (ref 11.5–15.5)
WBC: 8.4 10*3/uL (ref 4.0–10.5)
nRBC: 0 % (ref 0.0–0.2)

## 2023-06-09 LAB — COMPREHENSIVE METABOLIC PANEL
ALT: 31 U/L (ref 0–44)
AST: 32 U/L (ref 15–41)
Albumin: 4.5 g/dL (ref 3.5–5.0)
Alkaline Phosphatase: 62 U/L (ref 38–126)
Anion gap: 11 (ref 5–15)
BUN: 20 mg/dL (ref 6–20)
CO2: 26 mmol/L (ref 22–32)
Calcium: 9.5 mg/dL (ref 8.9–10.3)
Chloride: 102 mmol/L (ref 98–111)
Creatinine, Ser: 1.04 mg/dL (ref 0.61–1.24)
GFR, Estimated: >60 mL/min (ref >60)
Glucose, Bld: 99 mg/dL (ref 70–99)
Potassium: 4.1 mmol/L (ref 3.5–5.1)
Sodium: 139 mmol/L (ref 135–145)
Total Bilirubin: 0.5 mg/dL (ref 0.3–1.2)
Total Protein: 7.3 g/dL (ref 6.5–8.1)

## 2023-06-09 MED ORDER — TETANUS-DIPHTH-ACELL PERTUSSIS 5-2.5-18.5 LF-MCG/0.5 IM SUSY
0.5000 mL | PREFILLED_SYRINGE | Freq: Once | INTRAMUSCULAR | Status: AC
  Administered 2023-06-09: 0.5 mL via INTRAMUSCULAR
  Filled 2023-06-09: qty 0.5

## 2023-06-09 MED ORDER — BUPIVACAINE HCL 0.5 % IJ SOLN
50.0000 mL | Freq: Once | INTRAMUSCULAR | Status: AC
  Administered 2023-06-09: 50 mL

## 2023-06-09 MED ORDER — LACTATED RINGERS IV BOLUS
1000.0000 mL | Freq: Once | INTRAVENOUS | Status: AC
  Administered 2023-06-09: 1000 mL via INTRAVENOUS

## 2023-06-09 MED ORDER — CEPHALEXIN 500 MG PO CAPS: 500.0000 mg | ORAL_CAPSULE | Freq: Three times a day (TID) | ORAL | 0 refills | Status: AC

## 2023-06-09 MED ORDER — CYCLOBENZAPRINE HCL 10 MG PO TABS: 10.0000 mg | ORAL_TABLET | Freq: Two times a day (BID) | ORAL | 0 refills | Status: AC | PRN

## 2023-06-09 MED ORDER — MORPHINE SULFATE (PF) 4 MG/ML IV SOLN
4.0000 mg | Freq: Once | INTRAVENOUS | Status: AC
  Administered 2023-06-09: 4 mg via INTRAVENOUS
  Filled 2023-06-09: qty 1

## 2023-06-09 MED ORDER — BUPIVACAINE HCL 0.25 % IJ SOLN
20.0000 mL | Freq: Once | INTRAMUSCULAR | Status: DC
  Filled 2023-06-09: qty 20

## 2023-06-09 MED ORDER — HYDROMORPHONE HCL 1 MG/ML IJ SOLN
1.0000 mg | Freq: Once | INTRAMUSCULAR | Status: AC
  Administered 2023-06-09: 1 mg via INTRAVENOUS
  Filled 2023-06-09: qty 1

## 2023-06-09 MED ORDER — IOHEXOL 300 MG/ML  SOLN
100.0000 mL | Freq: Once | INTRAMUSCULAR | Status: AC | PRN
  Administered 2023-06-09: 100 mL via INTRAVENOUS

## 2023-06-09 MED ORDER — KETAMINE HCL 10 MG/ML IJ SOLN
1.0000 mg/kg | Freq: Once | INTRAMUSCULAR | Status: AC
  Administered 2023-06-09: 50 mg via INTRAVENOUS
  Filled 2023-06-09: qty 1

## 2023-06-09 MED ORDER — ONDANSETRON HCL 4 MG/2ML IJ SOLN
4.0000 mg | Freq: Once | INTRAMUSCULAR | Status: AC
  Administered 2023-06-09: 4 mg via INTRAVENOUS
  Filled 2023-06-09: qty 2

## 2023-06-09 MED ORDER — PROPOFOL 10 MG/ML IV BOLUS
0.5000 mg/kg | Freq: Once | INTRAVENOUS | Status: AC
  Administered 2023-06-09: 48.8 mg via INTRAVENOUS
  Filled 2023-06-09: qty 20

## 2023-06-09 NOTE — ED Provider Notes (Signed)
[place] EMERGENCY DEPARTMENT AT [facility] Provider Note   CSN: [id number] Arrival date & time: 06/09/23  1215     History {Add pertinent medical, surgical, social history, OB history to HPI:1} Chief Complaint  Patient presents with   Leg Injury    Steve Bender is a 47 y.o. male.  HPI     Home Medications Prior to Admission medications   Medication Sig Start Date End Date Taking? Authorizing Provider  ALPRAZolam (XANAX) 0.25 MG tablet Take 1 tablet (0.25 mg total) by mouth daily as needed for anxiety (use sparingly to avoid tolerance/dependence). 09/17/16   Sunnie Nielsen, DO  buPROPion (WELLBUTRIN XL) 150 MG 24 hr tablet Take 1 tablet (150 mg total) by mouth every morning. 09/17/16   Sunnie Nielsen, DO  dicyclomine (BENTYL) 20 MG tablet Take 1 tablet (20 mg total) by mouth 3 (three) times daily as needed (before meals for abdominal pain/bloatin). 09/17/16   Sunnie Nielsen, DO  hydrocortisone (ANUSOL-HC) 2.5 % rectal cream Place 1 application rectally 2 (two) times daily. 09/05/16   Zehr, Princella Pellegrini, PA-C  omeprazole (PRILOSEC) 20 MG capsule Take 1 capsule (20 mg total) by mouth daily. 01/05/17   Benjiman Core, MD  polyethylene glycol [facility] / Ethelene Hal) packet Take 17 g by mouth 2 (two) times daily as needed for moderate constipation. 09/17/16   Sunnie Nielsen, DO      Allergies    Patient has no known allergies.    Review of Systems   Review of Systems  Physical Exam Updated Vital Signs BP 136/79 (BP Location: Right Arm)   Pulse 78   Temp 98.4 F (36.9 C) (Oral)   Resp 20   Ht 6' (1.829 m)   Wt 97.5 kg   SpO2 100%   BMI 29.16 kg/m  Physical Exam  ED Results / Procedures / Treatments   Labs (all labs ordered are listed, but only abnormal results are displayed) Labs Reviewed  CBC WITH DIFFERENTIAL/PLATELET - Abnormal; Notable for the following components:      Result Value   Hemoglobin 17.4 (*)    All other components within  normal limits  COMPREHENSIVE METABOLIC PANEL    EKG None  Radiology CT CHEST ABDOMEN PELVIS W CONTRAST  Result Date: 06/09/2023 CLINICAL DATA:  Polytrauma, blunt EXAM: CT CHEST, ABDOMEN, AND PELVIS WITH CONTRAST TECHNIQUE: Multidetector CT imaging of the chest, abdomen and pelvis was performed following the standard protocol during bolus administration of intravenous contrast. RADIATION DOSE REDUCTION: This exam was performed according to the departmental dose-optimization program which includes automated exposure control, adjustment of the mA and/or kV according to patient size and/or use of iterative reconstruction technique. CONTRAST:  OMNIPAQUE IOHEXOL 300 MG/ML  SOLN COMPARISON:  CT scan abdomen and pelvis from 01/05/2017. FINDINGS: CT CHEST FINDINGS Cardiovascular: Normal cardiac size. No pericardial effusion. No aortic aneurysm. Mediastinum/Nodes: Visualized thyroid gland appears grossly unremarkable. No solid / cystic mediastinal masses. The esophagus is nondistended precluding optimal assessment. No axillary, mediastinal or hilar lymphadenopathy by size criteria. Lungs/Pleura: The central tracheo-bronchial tree is patent. There are dependent changes in bilateral lungs. No mass or consolidation. No pleural effusion or pneumothorax. No suspicious lung nodules. Musculoskeletal: The visualized soft tissues of the chest wall are grossly unremarkable. No suspicious osseous lesions. CT ABDOMEN PELVIS FINDINGS Hepatobiliary: The liver is normal in size. Non-cirrhotic configuration. No suspicious mass. No intrahepatic or extrahepatic bile duct dilation. No calcified gallstones. Normal gallbladder wall thickness. No pericholecystic inflammatory changes. Pancreas: Unremarkable. No pancreatic ductal dilatation  or surrounding inflammatory changes. Spleen: Within normal limits. No focal lesion. Adrenals/Urinary Tract: Adrenal glands are unremarkable. No suspicious renal mass. No hydronephrosis. No renal  or ureteric calculi. Unremarkable urinary bladder. Stomach/Bowel: No disproportionate dilation of the small or large bowel loops. No evidence of abnormal bowel wall thickening or inflammatory changes. The appendix is unremarkable. Vascular/Lymphatic: No ascites or pneumoperitoneum. No abdominal or pelvic lymphadenopathy, by size criteria. No aneurysmal dilation of the major abdominal arteries. Reproductive: Normal size prostate. Symmetric seminal vesicles. Other: There is a tiny fat containing umbilical hernia. The soft tissues and abdominal wall are otherwise unremarkable. Musculoskeletal: No suspicious osseous lesions. IMPRESSION: 1. No acute intrathoracic or intra-abdominal injury. 2. No acute fracture or listhesis. Electronically Signed   By: Jules Schick M.D.   On: 06/09/2023 14:49   CT Head Wo Contrast  Result Date: 06/09/2023 CLINICAL DATA:  Head trauma, GCS=15, loss of consciousness (LOC) (Ped 0-17y); Neck trauma, midline tenderness (Age 62-64y) EXAM: CT HEAD WITHOUT CONTRAST CT CERVICAL SPINE WITHOUT CONTRAST TECHNIQUE: Multidetector CT imaging of the head and cervical spine was performed following the standard protocol without intravenous contrast. Multiplanar CT image reconstructions of the cervical spine were also generated. RADIATION DOSE REDUCTION: This exam was performed according to the departmental dose-optimization program which includes automated exposure control, adjustment of the mA and/or kV according to patient size and/or use of iterative reconstruction technique. COMPARISON:  None Available. FINDINGS: CT HEAD FINDINGS Brain: No evidence of acute infarction, hemorrhage, hydrocephalus, extra-axial collection or mass lesion/mass effect. Vascular: No hyperdense vessel or unexpected calcification. Skull: Normal. Negative for fracture or focal lesion. Sinuses/Orbits: No middle ear or mastoid effusion. Paranasal sinuses are clear. Orbits are unremarkable. Other: None. CT CERVICAL SPINE  FINDINGS Alignment: Normal. Skull base and vertebrae: No acute fracture. No primary bone lesion or focal pathologic process. Soft tissues and spinal canal: No prevertebral fluid or swelling. No visible canal hematoma. Disc levels:  No evidence of high-grade spinal canal stenosis. Upper chest: Negative. Other: None IMPRESSION: 1. No acute intracranial abnormality. 2. No acute fracture or traumatic malalignment of the cervical spine. Electronically Signed   By: Lorenza Cambridge M.D.   On: 06/09/2023 14:27   CT Cervical Spine Wo Contrast  Result Date: 06/09/2023 CLINICAL DATA:  Head trauma, GCS=15, loss of consciousness (LOC) (Ped 0-17y); Neck trauma, midline tenderness (Age 42-64y) EXAM: CT HEAD WITHOUT CONTRAST CT CERVICAL SPINE WITHOUT CONTRAST TECHNIQUE: Multidetector CT imaging of the head and cervical spine was performed following the standard protocol without intravenous contrast. Multiplanar CT image reconstructions of the cervical spine were also generated. RADIATION DOSE REDUCTION: This exam was performed according to the departmental dose-optimization program which includes automated exposure control, adjustment of the mA and/or kV according to patient size and/or use of iterative reconstruction technique. COMPARISON:  None Available. FINDINGS: CT HEAD FINDINGS Brain: No evidence of acute infarction, hemorrhage, hydrocephalus, extra-axial collection or mass lesion/mass effect. Vascular: No hyperdense vessel or unexpected calcification. Skull: Normal. Negative for fracture or focal lesion. Sinuses/Orbits: No middle ear or mastoid effusion. Paranasal sinuses are clear. Orbits are unremarkable. Other: None. CT CERVICAL SPINE FINDINGS Alignment: Normal. Skull base and vertebrae: No acute fracture. No primary bone lesion or focal pathologic process. Soft tissues and spinal canal: No prevertebral fluid or swelling. No visible canal hematoma. Disc levels:  No evidence of high-grade spinal canal stenosis. Upper  chest: Negative. Other: None IMPRESSION: 1. No acute intracranial abnormality. 2. No acute fracture or traumatic malalignment of the cervical spine. Electronically Signed  By: Lorenza Cambridge M.D.   On: 06/09/2023 14:27   DG Tibia/Fibula Right  Result Date: 06/09/2023 CLINICAL DATA:  Pain after fall EXAM: RIGHT TIBIA AND FIBULA - 2 VIEW COMPARISON:  None Available. FINDINGS: No fracture or dislocation. Preserved joint spaces and bone mineralization. Bandages seen along the anteromedial mid lower leg region. No separate radiopaque foreign body at this time. IMPRESSION: No acute osseous abnormality.  Overlapping bandage. Electronically Signed   By: Karen Kays M.D.   On: 06/09/2023 14:10   DG Chest Portable 1 View  Result Date: 06/09/2023 CLINICAL DATA:  Pain after fall EXAM: PORTABLE CHEST 1 VIEW COMPARISON:  Chest x-ray 07/14/2016 FINDINGS: No consolidation, pneumothorax or effusion. Normal cardiopericardial silhouette without edema. Overlapping cardiac leads. IMPRESSION: No acute cardiopulmonary disease. Electronically Signed   By: Karen Kays M.D.   On: 06/09/2023 14:09    Procedures Procedures  {Document cardiac monitor, telemetry assessment procedure when appropriate:1}  Medications Ordered in ED Medications  bupivacaine (MARCAINE) 0.25 % (with pres) injection 20 mL (has no administration in time range)  morphine (PF) 4 MG/ML injection 4 mg (4 mg Intravenous Given 06/09/23 1322)  ondansetron (ZOFRAN) injection 4 mg (4 mg Intravenous Given 06/09/23 1322)  lactated ringers bolus 1,000 mL (0 mLs Intravenous Stopped 06/09/23 1501)  Tdap (BOOSTRIX) injection 0.5 mL (0.5 mLs Intramuscular Given 06/09/23 1328)  iohexol (OMNIPAQUE) 300 MG/ML solution 100 mL (100 mLs Intravenous Contrast Given 06/09/23 1358)  propofol (DIPRIVAN) 10 mg/mL bolus/IV push 48.8 mg (48.8 mg Intravenous Given 06/09/23 1546)  ketamine (KETALAR) injection 98 mg (50 mg Intravenous Given 06/09/23 1546)  lactated ringers bolus  1,000 mL (1,000 mLs Intravenous New Bag/Given 06/09/23 1457)  HYDROmorphone (DILAUDID) injection 1 mg (1 mg Intravenous Given 06/09/23 1502)    ED Course/ Medical Decision Making/ A&P   {   Click here for ABCD2, HEART and other calculatorsREFRESH Note before signing :1}                          Medical Decision Making Amount and/or Complexity of Data Reviewed Labs: ordered. Radiology: ordered.  Risk Prescription drug management.   ***  {Document critical care time when appropriate:1} {Document review of labs and clinical decision tools ie heart score, Chads2Vasc2 etc:1}  {Document your independent review of radiology images, and any outside records:1} {Document your discussion with family members, caretakers, and with consultants:1} {Document social determinants of health affecting pt's care:1} {Document your decision making why or why not admission, treatments were needed:1} Final Clinical Impression(s) / ED Diagnoses Final diagnoses:  None    Rx / DC Orders ED Discharge Orders     None

## 2023-06-09 NOTE — Consult Note (Signed)
ORTHOPAEDIC CONSULTATION  REQUESTING PHYSICIAN: Alvira Monday, MD  Chief Complaint: Right leg laceration  HPI: Steve Bender is a 47 y.o. male who presents with right leg laceration after a fall off a ladder.  He is otherwise healthy.  X-rays were obtained which did not show any evidence of fracture  Past Medical History:  Diagnosis Date   Anxiety    No past surgical history on file. Social History   Socioeconomic History   Marital status: Married    Spouse name: Not on file   Number of children: 2   Years of education: Not on file   Highest education level: Not on file  Occupational History   Occupation: residential electrician  Tobacco Use   Smoking status: Former    Types: Cigarettes   Smokeless tobacco: Never   Tobacco comments:    quit June 2017  Substance and Sexual Activity   Alcohol use: Yes    Alcohol/week: 0.0 standard drinks of alcohol    Comment: weekly   Drug use: No   Sexual activity: Not on file  Other Topics Concern   Not on file  Social History Narrative   Not on file   Social Determinants of Health   Financial Resource Strain: Not on file  Food Insecurity: Not on file  Transportation Needs: Not on file  Physical Activity: Not on file  Stress: Not on file  Social Connections: Unknown (03/22/2022)   Received from [facility]   Social Network    Social Network: Not on file   Family History  Problem Relation Age of Onset   Crohn's disease Mother    - negative except otherwise stated in the family history section No Known Allergies Prior to Admission medications   Medication Sig Start Date End Date Taking? Authorizing Provider  ALPRAZolam (XANAX) 0.25 MG tablet Take 1 tablet (0.25 mg total) by mouth daily as needed for anxiety (use sparingly to avoid tolerance/dependence). 09/17/16   Sunnie Nielsen, DO  buPROPion (WELLBUTRIN XL) 150 MG 24 hr tablet Take 1 tablet (150 mg total) by mouth every morning. 09/17/16   Sunnie Nielsen, DO   dicyclomine (BENTYL) 20 MG tablet Take 1 tablet (20 mg total) by mouth 3 (three) times daily as needed (before meals for abdominal pain/bloatin). 09/17/16   Sunnie Nielsen, DO  hydrocortisone (ANUSOL-HC) 2.5 % rectal cream Place 1 application rectally 2 (two) times daily. 09/05/16   Zehr, Princella Pellegrini, PA-C  omeprazole (PRILOSEC) 20 MG capsule Take 1 capsule (20 mg total) by mouth daily. 01/05/17   Benjiman Core, MD  polyethylene glycol [facility] / Ethelene Hal) packet Take 17 g by mouth 2 (two) times daily as needed for moderate constipation. 09/17/16   Sunnie Nielsen, DO   CT CHEST ABDOMEN PELVIS W CONTRAST  Result Date: 06/09/2023 CLINICAL DATA:  Polytrauma, blunt EXAM: CT CHEST, ABDOMEN, AND PELVIS WITH CONTRAST TECHNIQUE: Multidetector CT imaging of the chest, abdomen and pelvis was performed following the standard protocol during bolus administration of intravenous contrast. RADIATION DOSE REDUCTION: This exam was performed according to the departmental dose-optimization program which includes automated exposure control, adjustment of the mA and/or kV according to patient size and/or use of iterative reconstruction technique. CONTRAST:  OMNIPAQUE IOHEXOL 300 MG/ML  SOLN COMPARISON:  CT scan abdomen and pelvis from 01/05/2017. FINDINGS: CT CHEST FINDINGS Cardiovascular: Normal cardiac size. No pericardial effusion. No aortic aneurysm. Mediastinum/Nodes: Visualized thyroid gland appears grossly unremarkable. No solid / cystic mediastinal masses. The esophagus is nondistended precluding optimal assessment. No  axillary, mediastinal or hilar lymphadenopathy by size criteria. Lungs/Pleura: The central tracheo-bronchial tree is patent. There are dependent changes in bilateral lungs. No mass or consolidation. No pleural effusion or pneumothorax. No suspicious lung nodules. Musculoskeletal: The visualized soft tissues of the chest wall are grossly unremarkable. No suspicious osseous lesions. CT ABDOMEN  PELVIS FINDINGS Hepatobiliary: The liver is normal in size. Non-cirrhotic configuration. No suspicious mass. No intrahepatic or extrahepatic bile duct dilation. No calcified gallstones. Normal gallbladder wall thickness. No pericholecystic inflammatory changes. Pancreas: Unremarkable. No pancreatic ductal dilatation or surrounding inflammatory changes. Spleen: Within normal limits. No focal lesion. Adrenals/Urinary Tract: Adrenal glands are unremarkable. No suspicious renal mass. No hydronephrosis. No renal or ureteric calculi. Unremarkable urinary bladder. Stomach/Bowel: No disproportionate dilation of the small or large bowel loops. No evidence of abnormal bowel wall thickening or inflammatory changes. The appendix is unremarkable. Vascular/Lymphatic: No ascites or pneumoperitoneum. No abdominal or pelvic lymphadenopathy, by size criteria. No aneurysmal dilation of the major abdominal arteries. Reproductive: Normal size prostate. Symmetric seminal vesicles. Other: There is a tiny fat containing umbilical hernia. The soft tissues and abdominal wall are otherwise unremarkable. Musculoskeletal: No suspicious osseous lesions. IMPRESSION: 1. No acute intrathoracic or intra-abdominal injury. 2. No acute fracture or listhesis. Electronically Signed   By: Jules Schick M.D.   On: 06/09/2023 14:49   CT Head Wo Contrast  Result Date: 06/09/2023 CLINICAL DATA:  Head trauma, GCS=15, loss of consciousness (LOC) (Ped 0-17y); Neck trauma, midline tenderness (Age 45-64y) EXAM: CT HEAD WITHOUT CONTRAST CT CERVICAL SPINE WITHOUT CONTRAST TECHNIQUE: Multidetector CT imaging of the head and cervical spine was performed following the standard protocol without intravenous contrast. Multiplanar CT image reconstructions of the cervical spine were also generated. RADIATION DOSE REDUCTION: This exam was performed according to the departmental dose-optimization program which includes automated exposure control, adjustment of the mA  and/or kV according to patient size and/or use of iterative reconstruction technique. COMPARISON:  None Available. FINDINGS: CT HEAD FINDINGS Brain: No evidence of acute infarction, hemorrhage, hydrocephalus, extra-axial collection or mass lesion/mass effect. Vascular: No hyperdense vessel or unexpected calcification. Skull: Normal. Negative for fracture or focal lesion. Sinuses/Orbits: No middle ear or mastoid effusion. Paranasal sinuses are clear. Orbits are unremarkable. Other: None. CT CERVICAL SPINE FINDINGS Alignment: Normal. Skull base and vertebrae: No acute fracture. No primary bone lesion or focal pathologic process. Soft tissues and spinal canal: No prevertebral fluid or swelling. No visible canal hematoma. Disc levels:  No evidence of high-grade spinal canal stenosis. Upper chest: Negative. Other: None IMPRESSION: 1. No acute intracranial abnormality. 2. No acute fracture or traumatic malalignment of the cervical spine. Electronically Signed   By: Lorenza Cambridge M.D.   On: 06/09/2023 14:27   CT Cervical Spine Wo Contrast  Result Date: 06/09/2023 CLINICAL DATA:  Head trauma, GCS=15, loss of consciousness (LOC) (Ped 0-17y); Neck trauma, midline tenderness (Age 33-64y) EXAM: CT HEAD WITHOUT CONTRAST CT CERVICAL SPINE WITHOUT CONTRAST TECHNIQUE: Multidetector CT imaging of the head and cervical spine was performed following the standard protocol without intravenous contrast. Multiplanar CT image reconstructions of the cervical spine were also generated. RADIATION DOSE REDUCTION: This exam was performed according to the departmental dose-optimization program which includes automated exposure control, adjustment of the mA and/or kV according to patient size and/or use of iterative reconstruction technique. COMPARISON:  None Available. FINDINGS: CT HEAD FINDINGS Brain: No evidence of acute infarction, hemorrhage, hydrocephalus, extra-axial collection or mass lesion/mass effect. Vascular: No hyperdense vessel  or unexpected calcification. Skull: Normal. Negative  for fracture or focal lesion. Sinuses/Orbits: No middle ear or mastoid effusion. Paranasal sinuses are clear. Orbits are unremarkable. Other: None. CT CERVICAL SPINE FINDINGS Alignment: Normal. Skull base and vertebrae: No acute fracture. No primary bone lesion or focal pathologic process. Soft tissues and spinal canal: No prevertebral fluid or swelling. No visible canal hematoma. Disc levels:  No evidence of high-grade spinal canal stenosis. Upper chest: Negative. Other: None IMPRESSION: 1. No acute intracranial abnormality. 2. No acute fracture or traumatic malalignment of the cervical spine. Electronically Signed   By: Lorenza Cambridge M.D.   On: 06/09/2023 14:27   DG Tibia/Fibula Right  Result Date: 06/09/2023 CLINICAL DATA:  Pain after fall EXAM: RIGHT TIBIA AND FIBULA - 2 VIEW COMPARISON:  None Available. FINDINGS: No fracture or dislocation. Preserved joint spaces and bone mineralization. Bandages seen along the anteromedial mid lower leg region. No separate radiopaque foreign body at this time. IMPRESSION: No acute osseous abnormality.  Overlapping bandage. Electronically Signed   By: Karen Kays M.D.   On: 06/09/2023 14:10   DG Chest Portable 1 View  Result Date: 06/09/2023 CLINICAL DATA:  Pain after fall EXAM: PORTABLE CHEST 1 VIEW COMPARISON:  Chest x-ray 07/14/2016 FINDINGS: No consolidation, pneumothorax or effusion. Normal cardiopericardial silhouette without edema. Overlapping cardiac leads. IMPRESSION: No acute cardiopulmonary disease. Electronically Signed   By: Karen Kays M.D.   On: 06/09/2023 14:09     Positive ROS: All other systems have been reviewed and were otherwise negative with the exception of those mentioned in the HPI and as above.  Physical Exam: General: No acute distress Cardiovascular: No pedal edema Respiratory: No cyanosis, no use of accessory musculature GI: No organomegaly, abdomen is soft and non-tender Skin:  No lesions in the area of chief complaint Neurologic: Sensation intact distally Psychiatric: Patient is at baseline mood and affect Lymphatic: No axillary or cervical lymphadenopathy  MUSCULOSKELETAL:  Anterior tibial laceration measuring 15 x 3 cm.  There is no gross contamination.  Distal neurosensory exam is intact.  Independent Imaging Review: X-rays right tib-fib 2 views: Normal  Assessment: Right leg laceration after fall off a ladder.  This is overall very clean with viable tissue.  This time I will plan for conscious sedation in the emergency room with closure.  Plan: Plan for conscious sedation with closure in the emergency room and follow-up with myself in 2 weeks  Thank you for the consult and the opportunity to see Mr. Ammie Dalton, MD [facility] 4:00 PM

## 2023-06-09 NOTE — ED Notes (Signed)
Unable to assess pain scale during the procedure due to sedation

## 2023-06-09 NOTE — Progress Notes (Signed)
RT placed the Pt on 2l CO2 [place].  Ambu bag and suction was hooked up and the code cart was outside the door. CO2 remained 39-43 through the procedure. His HR 78, BP 136/78, SATS 100% and RR 9.The Pt woke up after the procedure with no issues.

## 2023-06-09 NOTE — ED Notes (Signed)
Soft collar placed on patient at this time.

## 2023-06-09 NOTE — ED Triage Notes (Signed)
Pt presents with lac to right leg. Pt reports he was cleaning the roof gutters and fell from the ladder. Bruises also noted to bilateral UE.

## 2023-06-09 NOTE — ED Notes (Addendum)
Pt reports losing consciousness, but states "not all the way", for unknown amount of time, unable to recall. Pt currently CA&O x4, NAD noted at this time.

## 2023-06-09 NOTE — ED Notes (Signed)
Pt given cup of water 

## 2023-06-09 NOTE — Sedation Documentation (Addendum)
Staples applied to right leg laceration

## 2023-06-09 NOTE — Procedures (Addendum)
The patient was identified in the emergency room.  The correct site was marked according universal protocol.  Timeout was held with the emergency room provider.  Anesthesia and conscious sedation was induced.  At this time the wound was thoroughly irrigated with 500 cc of normal saline.  This wound measured 15 x 3 cm.  There was no contamination.  The wound was then closed with staples.  This approximated nicely.  10 cc of 0.25% Marcaine with epinephrine for infiltrated.  Dry dressing with gauze and Ace wrap were applied.  EBL was 1 cc.  He will be weightbearing as tolerated and see me back in 2 week for follow-up

## 2023-06-09 NOTE — Sedation Documentation (Signed)
Steristrip applied to abrasions on left eyebrow

## 2023-06-09 NOTE — Sedation Documentation (Signed)
Staple applied to left leg laceration

## 2023-06-09 NOTE — Sedation Documentation (Signed)
Dressing applied to right lower leg

## 2023-06-12 ENCOUNTER — Telehealth: Payer: Self-pay | Admitting: Orthopaedic Surgery

## 2023-06-12 NOTE — Telephone Encounter (Signed)
Pt needs a ED follow up for at least 10 days from visit which would fall on 07/05 week he will be leave out of town on the 3rd and would like to know when can he come in being he will be out that whole week of the 5th

## 2023-06-16 ENCOUNTER — Encounter (HOSPITAL_BASED_OUTPATIENT_CLINIC_OR_DEPARTMENT_OTHER): Payer: Self-pay | Admitting: Student

## 2023-06-16 ENCOUNTER — Ambulatory Visit (INDEPENDENT_AMBULATORY_CARE_PROVIDER_SITE_OTHER): Payer: BC Managed Care – PPO | Admitting: Student

## 2023-06-16 DIAGNOSIS — S81811A Laceration without foreign body, right lower leg, initial encounter: Secondary | ICD-10-CM | POA: Diagnosis not present

## 2023-06-16 DIAGNOSIS — S81811S Laceration without foreign body, right lower leg, sequela: Secondary | ICD-10-CM | POA: Diagnosis not present

## 2023-06-16 DIAGNOSIS — S81812S Laceration without foreign body, left lower leg, sequela: Secondary | ICD-10-CM | POA: Diagnosis not present

## 2023-06-16 DIAGNOSIS — S81812A Laceration without foreign body, left lower leg, initial encounter: Secondary | ICD-10-CM | POA: Diagnosis not present

## 2023-06-16 NOTE — Progress Notes (Signed)
Chief Complaint: Laceration check and staple removal     History of Present Illness:    Steve Bender is a 47 y.o. male presenting today for evaluation 1 week status post lacerations to his lower extremities due to a fall off a ladder.  He did sustain a large laceration on the right lower leg requiring 13 staples and a small laceration on the left lower left leg with 1 staple.  Patient states that he has been experiencing a lot of soreness.  Has been taking a muscle relaxer and ibuprofen which has helped very little.  Denies any fever or chills.  He is headed to the beach this was weekend for a week and would like to to have everything checked over and his staples possibly removed before he leaves.   Surgical History:   None  PMH/PSH/Family History/Social History/Meds/Allergies:    Past Medical History:  Diagnosis Date   Anxiety    History reviewed. No pertinent surgical history. Social History   Socioeconomic History   Marital status: Married    Spouse name: Not on file   Number of children: 2   Years of education: Not on file   Highest education level: Not on file  Occupational History   Occupation: residential electrician  Tobacco Use   Smoking status: Former    Types: Cigarettes   Smokeless tobacco: Never   Tobacco comments:    quit June 2017  Substance and Sexual Activity   Alcohol use: Yes    Alcohol/week: 0.0 standard drinks of alcohol    Comment: weekly   Drug use: No   Sexual activity: Not on file  Other Topics Concern   Not on file  Social History Narrative   Not on file   Social Determinants of Health   Financial Resource Strain: Not on file  Food Insecurity: Not on file  Transportation Needs: Not on file  Physical Activity: Not on file  Stress: Not on file  Social Connections: Unknown (03/22/2022)   Received from [facility]   Social Network    Social Network: Not on file   Family History  Problem Relation  Age of Onset   Crohn's disease Mother    No Known Allergies Current Outpatient Medications  Medication Sig Dispense Refill   ALPRAZolam (XANAX) 0.25 MG tablet Take 1 tablet (0.25 mg total) by mouth daily as needed for anxiety (use sparingly to avoid tolerance/dependence). 10 tablet 0   buPROPion (WELLBUTRIN XL) 150 MG 24 hr tablet Take 1 tablet (150 mg total) by mouth every morning. 30 tablet 3   cephALEXin (KEFLEX) 500 MG capsule Take 1 capsule (500 mg total) by mouth 3 (three) times daily for 7 days. 21 capsule 0   cyclobenzaprine (FLEXERIL) 10 MG tablet Take 1 tablet (10 mg total) by mouth 2 (two) times daily as needed for muscle spasms. 20 tablet 0   dicyclomine (BENTYL) 20 MG tablet Take 1 tablet (20 mg total) by mouth 3 (three) times daily as needed (before meals for abdominal pain/bloatin). 30 tablet 3   hydrocortisone (ANUSOL-HC) 2.5 % rectal cream Place 1 application rectally 2 (two) times daily. 30 g 1   omeprazole (PRILOSEC) 20 MG capsule Take 1 capsule (20 mg total) by mouth daily. 14 capsule 0   polyethylene glycol (MIRALAX / GLYCOLAX) packet Take 17 g  by mouth 2 (two) times daily as needed for moderate constipation. 30 packet 6   No current facility-administered medications for this visit.   No results found.  Review of Systems:   A ROS was performed including pertinent positives and negatives as documented in the HPI.  Physical Exam :   Constitutional: NAD and appears stated age Neurological: Alert and oriented Psych: Appropriate affect and cooperative There were no vitals taken for this visit.   Comprehensive Musculoskeletal Exam:    Lacerations to anterior right and left lower legs are well appearing with staples in place.  No evidence of erythema or drainage.  Wound edges well approximated.  Neurosensory exam intact.  Imaging:      Assessment:   47 y.o. male 1 week status post lacerations to both lower extremities.  These are well-appearing today and wound has  achieved good closure with staples therefore I do believe staples can be removed today.  This was performed in clinic without any complication and wounds were covered with 4 x 4 gauze and Tegaderm.  Recommend continuing to take Tylenol and ibuprofen for pain as needed.  Plan to follow-up on an as-needed basis.  Plan :    - Return to clinic as needed     I personally saw and evaluated the patient, and participated in the management and treatment plan.  Hazle Nordmann, PA-C Orthopedics  This document was dictated using Conservation officer, historic buildings. A reasonable attempt at proof reading has been made to minimize errors.

## 2023-06-17 DIAGNOSIS — T8133XA Disruption of traumatic injury wound repair, initial encounter: Secondary | ICD-10-CM | POA: Diagnosis not present

## 2023-06-17 DIAGNOSIS — S81811A Laceration without foreign body, right lower leg, initial encounter: Secondary | ICD-10-CM | POA: Diagnosis not present

## 2023-06-17 DIAGNOSIS — W228XXA Striking against or struck by other objects, initial encounter: Secondary | ICD-10-CM | POA: Diagnosis not present

## 2023-06-17 DIAGNOSIS — F17210 Nicotine dependence, cigarettes, uncomplicated: Secondary | ICD-10-CM | POA: Diagnosis not present

## 2023-06-27 ENCOUNTER — Ambulatory Visit (INDEPENDENT_AMBULATORY_CARE_PROVIDER_SITE_OTHER): Payer: BC Managed Care – PPO | Admitting: Student

## 2023-06-27 ENCOUNTER — Encounter (HOSPITAL_BASED_OUTPATIENT_CLINIC_OR_DEPARTMENT_OTHER): Payer: Self-pay | Admitting: Student

## 2023-06-27 DIAGNOSIS — S81811S Laceration without foreign body, right lower leg, sequela: Secondary | ICD-10-CM

## 2023-06-27 NOTE — Progress Notes (Signed)
Chief Complaint: Laceration check and staple removal     History of Present Illness:   06/27/23: Patient presents today for wound check.  Patient was seen back in the emergency department on 06/17/23 for dehiscence of the laceration on his right leg.  This was re-stapled and he was started on Keflex.  He denies any fever or chills.   06/16/23: Dionis Spar is a 47 y.o. male presenting today for evaluation 1 week status post lacerations to his lower extremities due to a fall off a ladder.  He did sustain a large laceration on the right lower leg requiring 13 staples and a small laceration on the left lower left leg with 1 staple.  Patient states that he has been experiencing a lot of soreness.  Has been taking a muscle relaxer and ibuprofen which has helped very little.  Denies any fever or chills.  He is headed to the beach this was weekend for a week and would like to to have everything checked over and his staples possibly removed before he leaves.   Surgical History:   None  PMH/PSH/Family History/Social History/Meds/Allergies:    Past Medical History:  Diagnosis Date   Anxiety    History reviewed. No pertinent surgical history. Social History   Socioeconomic History   Marital status: Married    Spouse name: Not on file   Number of children: 2   Years of education: Not on file   Highest education level: Not on file  Occupational History   Occupation: residential electrician  Tobacco Use   Smoking status: Former    Types: Cigarettes   Smokeless tobacco: Never   Tobacco comments:    quit June 2017  Substance and Sexual Activity   Alcohol use: Yes    Alcohol/week: 0.0 standard drinks of alcohol    Comment: weekly   Drug use: No   Sexual activity: Not on file  Other Topics Concern   Not on file  Social History Narrative   Not on file   Social Determinants of Health   Financial Resource Strain: Not on file  Food Insecurity: Not on  file  Transportation Needs: Not on file  Physical Activity: Not on file  Stress: Not on file  Social Connections: Unknown (06/17/2023)   Received from [facility]   Social Network    Social Network: Not on file   Family History  Problem Relation Age of Onset   Crohn's disease Mother    No Known Allergies Current Outpatient Medications  Medication Sig Dispense Refill   ALPRAZolam (XANAX) 0.25 MG tablet Take 1 tablet (0.25 mg total) by mouth daily as needed for anxiety (use sparingly to avoid tolerance/dependence). 10 tablet 0   buPROPion (WELLBUTRIN XL) 150 MG 24 hr tablet Take 1 tablet (150 mg total) by mouth every morning. 30 tablet 3   cyclobenzaprine (FLEXERIL) 10 MG tablet Take 1 tablet (10 mg total) by mouth 2 (two) times daily as needed for muscle spasms. 20 tablet 0   dicyclomine (BENTYL) 20 MG tablet Take 1 tablet (20 mg total) by mouth 3 (three) times daily as needed (before meals for abdominal pain/bloatin). 30 tablet 3   hydrocortisone (ANUSOL-HC) 2.5 % rectal cream Place 1 application rectally 2 (two) times daily. 30 g 1   omeprazole (PRILOSEC) 20 MG capsule Take 1  capsule (20 mg total) by mouth daily. 14 capsule 0   polyethylene glycol (MIRALAX / GLYCOLAX) packet Take 17 g by mouth 2 (two) times daily as needed for moderate constipation. 30 packet 6   No current facility-administered medications for this visit.   No results found.  Review of Systems:   A ROS was performed including pertinent positives and negatives as documented in the HPI.  Physical Exam :   Constitutional: NAD and appears stated age Neurological: Alert and oriented Psych: Appropriate affect and cooperative There were no vitals taken for this visit.   Comprehensive Musculoskeletal Exam:    Laceration of right lower extremity is well approximated with staples in place.  Mild erythema surrounding the wound suggesting staple irritation.  No other evidence of erythema, drainage, or  warmth.   Imaging:      Assessment:   47 y.o. male status post wound dehiscence of right lower leg laceration.  This was reapproximated with staples that are still in place and he has been taking Keflex.  The wound is well-appearing today without evidence suggesting infection.  Will plan to leave the staples in place for 1 more week and then have him return for staple removal.  Plan :    - Return to clinic in 1 week for staple removal     I personally saw and evaluated the patient, and participated in the management and treatment plan.  Hazle Nordmann, PA-C Orthopedics  This document was dictated using Conservation officer, historic buildings. A reasonable attempt at proof reading has been made to minimize errors.

## 2023-07-03 ENCOUNTER — Encounter (HOSPITAL_BASED_OUTPATIENT_CLINIC_OR_DEPARTMENT_OTHER): Payer: Self-pay | Admitting: Student

## 2023-07-03 ENCOUNTER — Ambulatory Visit (INDEPENDENT_AMBULATORY_CARE_PROVIDER_SITE_OTHER): Payer: BC Managed Care – PPO | Admitting: Student

## 2023-07-03 DIAGNOSIS — S81811D Laceration without foreign body, right lower leg, subsequent encounter: Secondary | ICD-10-CM | POA: Diagnosis not present

## 2023-07-03 DIAGNOSIS — S81811S Laceration without foreign body, right lower leg, sequela: Secondary | ICD-10-CM | POA: Diagnosis not present

## 2023-07-03 NOTE — Progress Notes (Signed)
Chief Complaint: Laceration check and staple removal     History of Present Illness:   07/03/23: Patient presents today for follow-up of right lower leg laceration.  Staples have been in place since 06/17/2023.  Reports that the wound has been healing well.  Denies any redness, fever, or chills.  Has been using occasional triple antibiotic ointment.   06/27/23: Patient presents today for wound check.  Patient was seen back in the emergency department on 06/17/23 for dehiscence of the laceration on his right leg.  This was re-stapled and he was started on Keflex.  He denies any fever or chills.   Surgical History:   None  PMH/PSH/Family History/Social History/Meds/Allergies:    Past Medical History:  Diagnosis Date   Anxiety    History reviewed. No pertinent surgical history. Social History   Socioeconomic History   Marital status: Married    Spouse name: Not on file   Number of children: 2   Years of education: Not on file   Highest education level: Not on file  Occupational History   Occupation: residential electrician  Tobacco Use   Smoking status: Former    Types: Cigarettes   Smokeless tobacco: Never   Tobacco comments:    quit June 2017  Substance and Sexual Activity   Alcohol use: Yes    Alcohol/week: 0.0 standard drinks of alcohol    Comment: weekly   Drug use: No   Sexual activity: Not on file  Other Topics Concern   Not on file  Social History Narrative   Not on file   Social Determinants of Health   Financial Resource Strain: Not on file  Food Insecurity: Not on file  Transportation Needs: Not on file  Physical Activity: Not on file  Stress: Not on file  Social Connections: Unknown (06/17/2023)   Received from [facility]   Social Network    Social Network: Not on file   Family History  Problem Relation Age of Onset   Crohn's disease Mother    No Known Allergies Current Outpatient Medications  Medication Sig  Dispense Refill   ALPRAZolam (XANAX) 0.25 MG tablet Take 1 tablet (0.25 mg total) by mouth daily as needed for anxiety (use sparingly to avoid tolerance/dependence). 10 tablet 0   buPROPion (WELLBUTRIN XL) 150 MG 24 hr tablet Take 1 tablet (150 mg total) by mouth every morning. 30 tablet 3   cyclobenzaprine (FLEXERIL) 10 MG tablet Take 1 tablet (10 mg total) by mouth 2 (two) times daily as needed for muscle spasms. 20 tablet 0   dicyclomine (BENTYL) 20 MG tablet Take 1 tablet (20 mg total) by mouth 3 (three) times daily as needed (before meals for abdominal pain/bloatin). 30 tablet 3   hydrocortisone (ANUSOL-HC) 2.5 % rectal cream Place 1 application rectally 2 (two) times daily. 30 g 1   omeprazole (PRILOSEC) 20 MG capsule Take 1 capsule (20 mg total) by mouth daily. 14 capsule 0   polyethylene glycol (MIRALAX / GLYCOLAX) packet Take 17 g by mouth 2 (two) times daily as needed for moderate constipation. 30 packet 6   No current facility-administered medications for this visit.   No results found.  Review of Systems:   A ROS was performed including pertinent positives and negatives as documented in the HPI.  Physical Exam :  Constitutional: NAD and appears stated age Neurological: Alert and oriented Psych: Appropriate affect and cooperative There were no vitals taken for this visit.   Comprehensive Musculoskeletal Exam:    Laceration of right lower extremity is intact without erythema or drainage.  Wound edges are well-approximated with staples in place.  Distal neurosensory is intact.   Imaging:     Assessment:   47 y.o. male 2-week status post laceration dehiscence of the right lower leg.  This has approximated and healed well with staples.  No evidence today suggest infection.  Staples are ready to be removed today but I will place Steri-Strips over the wound to prevent any recurrence of complication.  Gave continued wound care instructions.  Will plan to see him back as  needed.  Plan :    -Staples removed and Steri-Strips placed today - Return to clinic as needed     I personally saw and evaluated the patient, and participated in the management and treatment plan.  Hazle Nordmann, PA-C Orthopedics  This document was dictated using Conservation officer, historic buildings. A reasonable attempt at proof reading has been made to minimize errors.

## 2023-07-04 NOTE — ED Notes (Signed)
LATE ENTRY  Procedural consent obtained from patient. Consent verified with physician and patient. Pt signature did not capture on signature pad, and did not transfer to media in chart.

## 2023-09-12 DIAGNOSIS — Z0100 Encounter for examination of eyes and vision without abnormal findings: Secondary | ICD-10-CM | POA: Diagnosis not present

## 2023-09-30 DIAGNOSIS — F900 Attention-deficit hyperactivity disorder, predominantly inattentive type: Secondary | ICD-10-CM | POA: Diagnosis not present

## 2023-09-30 DIAGNOSIS — F411 Generalized anxiety disorder: Secondary | ICD-10-CM | POA: Diagnosis not present

## 2024-02-24 DIAGNOSIS — F900 Attention-deficit hyperactivity disorder, predominantly inattentive type: Secondary | ICD-10-CM | POA: Diagnosis not present

## 2024-02-24 DIAGNOSIS — F411 Generalized anxiety disorder: Secondary | ICD-10-CM | POA: Diagnosis not present

## 2024-11-02 ENCOUNTER — Other Ambulatory Visit: Payer: Self-pay

## 2024-11-02 ENCOUNTER — Emergency Department (HOSPITAL_BASED_OUTPATIENT_CLINIC_OR_DEPARTMENT_OTHER)
Admission: EM | Admit: 2024-11-02 | Discharge: 2024-11-03 | Disposition: A | Attending: Emergency Medicine | Admitting: Emergency Medicine

## 2024-11-02 ENCOUNTER — Encounter (HOSPITAL_BASED_OUTPATIENT_CLINIC_OR_DEPARTMENT_OTHER): Payer: Self-pay

## 2024-11-02 DIAGNOSIS — R1013 Epigastric pain: Secondary | ICD-10-CM | POA: Insufficient documentation

## 2024-11-02 DIAGNOSIS — Z79899 Other long term (current) drug therapy: Secondary | ICD-10-CM | POA: Insufficient documentation

## 2024-11-02 DIAGNOSIS — R140 Abdominal distension (gaseous): Secondary | ICD-10-CM | POA: Insufficient documentation

## 2024-11-02 LAB — COMPREHENSIVE METABOLIC PANEL WITH GFR
ALT: 34 U/L (ref 0–44)
AST: 30 U/L (ref 15–41)
Albumin: 4.4 g/dL (ref 3.5–5.0)
Alkaline Phosphatase: 63 U/L (ref 38–126)
Anion gap: 8 (ref 5–15)
BUN: 9 mg/dL (ref 6–20)
CO2: 28 mmol/L (ref 22–32)
Calcium: 9.5 mg/dL (ref 8.9–10.3)
Chloride: 104 mmol/L (ref 98–111)
Creatinine, Ser: 1.21 mg/dL (ref 0.61–1.24)
GFR, Estimated: >60 mL/min
Glucose, Bld: 110 mg/dL — ABNORMAL HIGH (ref 70–99)
Potassium: 4.5 mmol/L (ref 3.5–5.1)
Sodium: 140 mmol/L (ref 135–145)
Total Bilirubin: 0.4 mg/dL (ref 0.0–1.2)
Total Protein: 6.8 g/dL (ref 6.5–8.1)

## 2024-11-02 LAB — URINALYSIS, ROUTINE W REFLEX MICROSCOPIC
Bilirubin Urine: NEGATIVE
Glucose, UA: NEGATIVE mg/dL
Hgb urine dipstick: NEGATIVE
Ketones, ur: NEGATIVE mg/dL
Leukocytes,Ua: NEGATIVE
Nitrite: NEGATIVE
Specific Gravity, Urine: 1.027 (ref 1.005–1.030)
pH: 7 (ref 5.0–8.0)

## 2024-11-02 LAB — CBC
HCT: 46.4 % (ref 39.0–52.0)
Hemoglobin: 15.8 g/dL (ref 13.0–17.0)
MCH: 30.8 pg (ref 26.0–34.0)
MCHC: 34.1 g/dL (ref 30.0–36.0)
MCV: 90.4 fL (ref 80.0–100.0)
Platelets: 264 K/uL (ref 150–400)
RBC: 5.13 MIL/uL (ref 4.22–5.81)
RDW: 12.9 % (ref 11.5–15.5)
WBC: 7.6 K/uL (ref 4.0–10.5)
nRBC: 0 % (ref 0.0–0.2)

## 2024-11-02 LAB — LIPASE, BLOOD: Lipase: 21 U/L (ref 11–51)

## 2024-11-02 MED ORDER — SODIUM CHLORIDE 0.9 % IV BOLUS
1000.0000 mL | Freq: Once | INTRAVENOUS | Status: AC
  Administered 2024-11-03: 1000 mL via INTRAVENOUS

## 2024-11-02 NOTE — ED Provider Notes (Signed)
 " [place] EMERGENCY DEPARTMENT AT [facility] Provider Note   CSN: [id number] Arrival date & time: 11/02/24  2047     Patient presents with: Abdominal Pain   Steve Bender is a 48 y.o. male.  {Add pertinent medical, surgical, social history, OB history to HPI:32947} Patient is a 48 year old male presenting with complaints of abdominal distention.  He tells me that every time he eats his stomach blows up like a balloon.  No matter what he eats or drinks, he states that he develops distention to the upper abdomen.  He denies significant pain, but the sensation is uncomfortable.  He denies any fevers or chills.  No diarrhea or constipation.  He reports similar episode several years ago for which he underwent a workup which was unremarkable.       Prior to Admission medications  Medication Sig Start Date End Date Taking? Authorizing Provider  ALPRAZolam  (XANAX ) 0.25 MG tablet Take 1 tablet (0.25 mg total) by mouth daily as needed for anxiety (use sparingly to avoid tolerance/dependence). 09/17/16   Alexander, Natalie, DO  buPROPion  (WELLBUTRIN  XL) 150 MG 24 hr tablet Take 1 tablet (150 mg total) by mouth every morning. 09/17/16   Alexander, Natalie, DO  cyclobenzaprine  (FLEXERIL ) 10 MG tablet Take 1 tablet (10 mg total) by mouth 2 (two) times daily as needed for muscle spasms. 06/09/23   Dreama Longs, MD  dicyclomine  (BENTYL ) 20 MG tablet Take 1 tablet (20 mg total) by mouth 3 (three) times daily as needed (before meals for abdominal pain/bloatin). 09/17/16   Alexander, Natalie, DO  hydrocortisone  (ANUSOL -HC) 2.5 % rectal cream Place 1 application rectally 2 (two) times daily. 09/05/16   Zehr, Jessica D, PA-C  omeprazole  (PRILOSEC) 20 MG capsule Take 1 capsule (20 mg total) by mouth daily. 01/05/17   Patsey Lot, MD  polyethylene glycol (MIRALAX  / GLYCOLAX ) packet Take 17 g by mouth 2 (two) times daily as needed for moderate constipation. 09/17/16   Alexander, Natalie, DO     Allergies: Patient has no known allergies.    Review of Systems  All other systems reviewed and are negative.   Updated Vital Signs BP 128/67   Pulse 76   Temp 98.2 F (36.8 C)   Resp 20   Ht 6' (1.829 m)   Wt 97.5 kg   SpO2 98%   BMI 29.16 kg/m   Physical Exam Vitals and nursing note reviewed.  Constitutional:      General: He is not in acute distress.    Appearance: He is well-developed. He is not diaphoretic.  HENT:     Head: Normocephalic and atraumatic.  Cardiovascular:     Rate and Rhythm: Normal rate and regular rhythm.     Heart sounds: No murmur heard.    No friction rub.  Pulmonary:     Effort: Pulmonary effort is normal. No respiratory distress.     Breath sounds: Normal breath sounds. No wheezing or rales.  Abdominal:     General: Bowel sounds are normal. There is no distension.     Palpations: Abdomen is soft.     Tenderness: There is abdominal tenderness in the epigastric area. There is no right CVA tenderness, left CVA tenderness, guarding or rebound.  Musculoskeletal:        General: Normal range of motion.     Cervical back: Normal range of motion and neck supple.  Skin:    General: Skin is warm and dry.  Neurological:     Mental Status: He  is alert and oriented to person, place, and time.     Coordination: Coordination normal.     (all labs ordered are listed, but only abnormal results are displayed) Labs Reviewed  COMPREHENSIVE METABOLIC PANEL WITH GFR - Abnormal; Notable for the following components:      Result Value   Glucose, Bld 110 (*)    All other components within normal limits  URINALYSIS, ROUTINE W REFLEX MICROSCOPIC - Abnormal; Notable for the following components:   Protein, ur TRACE (*)    All other components within normal limits  LIPASE, BLOOD  CBC    EKG: None  Radiology: No results found.  {Document cardiac monitor, telemetry assessment procedure when appropriate:32947} Procedures   Medications Ordered in  the ED  sodium chloride  0.9 % bolus 1,000 mL (has no administration in time range)      {Click here for ABCD2, HEART and other calculators REFRESH Note before signing:1}                              Medical Decision Making Amount and/or Complexity of Data Reviewed Labs: ordered. Radiology: ordered.   ***  {Document critical care time when appropriate  Document review of labs and clinical decision tools ie CHADS2VASC2, etc  Document your independent review of radiology images and any outside records  Document your discussion with family members, caretakers and with consultants  Document social determinants of health affecting pt's care  Document your decision making why or why not admission, treatments were needed:32947:::1}   Final diagnoses:  None    ED Discharge Orders     None        "

## 2024-11-02 NOTE — ED Triage Notes (Signed)
 Pt reports abd swelling x2 weeks after eating. Pt denies any N/V/D.

## 2024-11-03 ENCOUNTER — Emergency Department (HOSPITAL_BASED_OUTPATIENT_CLINIC_OR_DEPARTMENT_OTHER)

## 2024-11-03 MED ORDER — IOHEXOL 300 MG/ML  SOLN
100.0000 mL | Freq: Once | INTRAMUSCULAR | Status: AC | PRN
  Administered 2024-11-03: 100 mL via INTRAVENOUS

## 2024-11-03 NOTE — Discharge Instructions (Signed)
 Follow-up with gastroenterology if symptoms persist.  The contact information for Dr. Leigh has been provided in this discharge summary for you to call and make these arrangements.

## 2024-11-03 NOTE — ED Notes (Signed)
 Patient transported to CT

## 2024-11-22 ENCOUNTER — Ambulatory Visit: Admitting: Gastroenterology
